# Patient Record
Sex: Female | Born: 1955 | Race: White | Hispanic: No | Marital: Married | Smoking: Never smoker
Health system: Southern US, Community
[De-identification: ages and names within clinical notes are randomized; demographics above are authoritative.]

---

## 1983-07-15 HISTORY — PX: BUNIONECTOMY: SHX129

## 1996-07-14 HISTORY — PX: APPENDECTOMY: SHX54

## 2021-02-18 ENCOUNTER — Ambulatory Visit: Payer: Medicare Other | Admitting: Adult Health

## 2021-04-02 ENCOUNTER — Ambulatory Visit: Payer: Medicare Other | Admitting: Adult Health

## 2021-05-06 ENCOUNTER — Ambulatory Visit: Payer: Medicare Other | Admitting: Adult Health

## 2021-05-23 ENCOUNTER — Ambulatory Visit: Payer: Medicare Other | Admitting: Adult Health

## 2021-07-23 ENCOUNTER — Ambulatory Visit (INDEPENDENT_AMBULATORY_CARE_PROVIDER_SITE_OTHER): Payer: Medicare Other | Admitting: Internal Medicine

## 2021-07-23 ENCOUNTER — Encounter: Payer: Self-pay | Admitting: Internal Medicine

## 2021-07-23 ENCOUNTER — Other Ambulatory Visit: Payer: Self-pay

## 2021-07-23 VITALS — BP 148/75 | HR 72 | Temp 97.5°F | Resp 18 | Ht <= 58 in | Wt 133.4 lb

## 2021-07-23 DIAGNOSIS — R030 Elevated blood-pressure reading, without diagnosis of hypertension: Secondary | ICD-10-CM | POA: Diagnosis not present

## 2021-07-23 DIAGNOSIS — H539 Unspecified visual disturbance: Secondary | ICD-10-CM

## 2021-07-23 DIAGNOSIS — R7309 Other abnormal glucose: Secondary | ICD-10-CM | POA: Diagnosis not present

## 2021-07-23 DIAGNOSIS — E559 Vitamin D deficiency, unspecified: Secondary | ICD-10-CM | POA: Diagnosis not present

## 2021-07-23 DIAGNOSIS — Z1231 Encounter for screening mammogram for malignant neoplasm of breast: Secondary | ICD-10-CM

## 2021-07-23 DIAGNOSIS — Z6826 Body mass index (BMI) 26.0-26.9, adult: Secondary | ICD-10-CM | POA: Insufficient documentation

## 2021-07-23 DIAGNOSIS — Z0001 Encounter for general adult medical examination with abnormal findings: Secondary | ICD-10-CM | POA: Diagnosis not present

## 2021-07-23 DIAGNOSIS — Z1159 Encounter for screening for other viral diseases: Secondary | ICD-10-CM | POA: Diagnosis not present

## 2021-07-23 DIAGNOSIS — Z6827 Body mass index (BMI) 27.0-27.9, adult: Secondary | ICD-10-CM | POA: Insufficient documentation

## 2021-07-23 DIAGNOSIS — E785 Hyperlipidemia, unspecified: Secondary | ICD-10-CM | POA: Diagnosis not present

## 2021-07-23 DIAGNOSIS — E663 Overweight: Secondary | ICD-10-CM | POA: Insufficient documentation

## 2021-07-23 DIAGNOSIS — Z780 Asymptomatic menopausal state: Secondary | ICD-10-CM | POA: Diagnosis not present

## 2021-07-23 DIAGNOSIS — Z6825 Body mass index (BMI) 25.0-25.9, adult: Secondary | ICD-10-CM | POA: Insufficient documentation

## 2021-07-23 NOTE — Assessment & Plan Note (Signed)
Elevated  today, manual repeat improved but still elevated We will check at her next visit and if remains elevated will discuss medication therapy

## 2021-07-23 NOTE — Progress Notes (Signed)
HPI  Patient presents the clinic today to establish care.  She reports she recently moved from [place].  She has not seen a PCP in a few years.    Her BP today is 166/78.  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  She reports her blood pressure at home runs in the 120s over 70s.  Flu: never Tetanus: < 10 years ago Covid: never Shingrix: never Pneumovax: never Prevnar: never Pap smear: 2-3 years ago Mammogram: 2 years ago Bone density: never Colon screening: within the last 5 years Vision screening: annually Dentist: biannually  Diet: She does eat meat. She consumes fruits and veggies. She does eat some fried foods. She drinks mostly water, decaf iced tea. Exercise: Tai Chi, walking  History reviewed. No pertinent past medical history.  Current Outpatient Medications  Medication Sig Dispense Refill   Cholecalciferol (VITAMIN D3) 25 MCG (1000 UT) CAPS Take by mouth.     Multiple Vitamin (MULTIVITAMIN) tablet Take 1 tablet by mouth daily.     No current facility-administered medications for this visit.    Allergies  Allergen Reactions   Grass Pollen(K-O-R-T-Swt Vern)    Tree Extract     History reviewed. No pertinent family history.  Social History   Socioeconomic History   Marital status: Single    Spouse name: Not on file   Number of children: Not on file   Years of education: Not on file   Highest education level: Not on file  Occupational History   Not on file  Tobacco Use   Smoking status: Never   Smokeless tobacco: Never  Vaping Use   Vaping Use: Never used  Substance and Sexual Activity   Alcohol use: Yes    Comment: occasionally   Drug use: Never   Sexual activity: Not on file  Other Topics Concern   Not on file  Social History Narrative   Not on file   Social Determinants of Health   Financial Resource Strain: Not on file  Food Insecurity: Not on file  Transportation Needs: Not on file  Physical Activity: Not on file   Stress: Not on file  Social Connections: Not on file  Intimate Partner Violence: Not on file    ROS:  Constitutional: Denies fever, malaise, fatigue, headache or abrupt weight changes.  HEENT: Denies eye pain, eye redness, ear pain, ringing in the ears, wax buildup, runny nose, nasal congestion, bloody nose, or sore throat. Respiratory: Denies difficulty breathing, shortness of breath, cough or sputum production.   Cardiovascular: Denies chest pain, chest tightness, palpitations or swelling in the hands or feet.  Gastrointestinal: Denies abdominal pain, bloating, constipation, diarrhea or blood in the stool.  GU: Denies frequency, urgency, pain with urination, blood in urine, odor or discharge. Musculoskeletal: Denies decrease in range of motion, difficulty with gait, muscle pain or joint pain and swelling.  Skin: Denies redness, rashes, lesions or ulcercations.  Neurological: Denies dizziness, difficulty with memory, difficulty with speech or problems with balance and coordination.  Psych: Denies anxiety, depression, SI/HI.  No other specific complaints in a complete review of systems (except as listed in HPI above).  PE:  BP (!) 166/78 (BP Location: Left Arm, Patient Position: Sitting, Cuff Size: Normal)    Pulse 72    Temp (!) 97.5 F (36.4 C) (Temporal)    Resp 18    Ht 4' 10"  (1.473 m)    Wt 133 lb 6.4 oz (60.5 kg)    LMP 07/23/2013  SpO2 100%    BMI 27.88 kg/m  Wt Readings from Last 3 Encounters:  07/23/21 133 lb 6.4 oz (60.5 kg)    General: Appears her stated age, overweight, in NAD. HEENT: Head: normal shape and size; Eyes: sclera white and EOMs intact;  Neck: Neck supple, trachea midline. No masses, lumps or thyromegaly present.  Cardiovascular: Normal rate and rhythm. S1,S2 noted.  Murmur noted. No JVD or BLE edema. No carotid bruits noted. Pulmonary/Chest: Normal effort and positive vesicular breath sounds. No respiratory distress. No wheezes, rales or ronchi noted.   Abdomen: Soft and nontender. Normal bowel sounds. No distention or masses noted. Liver, spleen and kidneys non palpable. Musculoskeletal: Strength 5/5 BUE/BLE. No difficulty with gait.  Neurological: Alert and oriented. Cranial nerves II-XII grossly intact. Coordination normal.  Psychiatric: Mood and affect normal. Behavior is normal. Judgment and thought content normal.    Assessment and Plan:  Preventative Health Maintenance:  She declines flu shot Tetanus UTD per her report She declines COVID-vaccine She is considering Shingrix vaccine, she will check coverage with her insurance company and get this at the pharmacy if she decides to get it She declines Pneumovax and Prevnar Pap smear UTD per her report Mammogram and bone density ordered-she will call to schedule Colon screening UTD per her report Encouraged her to consume a balanced diet and exercise regimen Advised her to see an eye doctor and dentist annually Will check CBC, c-Met, lipid, A1c, hep C and vitamin D today  RTC in 1 year, sooner if needed Webb Silversmith, NP This visit occurred during the SARS-CoV-2 public health emergency.  Safety protocols were in place, including screening questions prior to the visit, additional usage of staff PPE, and extensive cleaning of exam room while observing appropriate contact time as indicated for disinfecting solutions.    Webb Silversmith, NP This visit occurred during the SARS-CoV-2 public health emergency.  Safety protocols were in place, including screening questions prior to the visit, additional usage of staff PPE, and extensive cleaning of exam room while observing appropriate contact time as indicated for disinfecting solutions.

## 2021-07-23 NOTE — Patient Instructions (Signed)
Health Maintenance for Postmenopausal Women ?Menopause is a normal process in which your ability to get pregnant comes to an end. This process happens slowly over many months or years, usually between the ages of 48 and 55. Menopause is complete when you have missed your menstrual period for 12 months. ?It is important to talk with your health care provider about some of the most common conditions that affect women after menopause (postmenopausal women). These include heart disease, cancer, and bone loss (osteoporosis). Adopting a healthy lifestyle and getting preventive care can help to promote your health and wellness. The actions you take can also lower your chances of developing some of these common conditions. ?What are the signs and symptoms of menopause? ?During menopause, you may have the following symptoms: ?Hot flashes. These can be moderate or severe. ?Night sweats. ?Decrease in sex drive. ?Mood swings. ?Headaches. ?Tiredness (fatigue). ?Irritability. ?Memory problems. ?Problems falling asleep or staying asleep. ?Talk with your health care provider about treatment options for your symptoms. ?Do I need hormone replacement therapy? ?Hormone replacement therapy is effective in treating symptoms that are caused by menopause, such as hot flashes and night sweats. ?Hormone replacement carries certain risks, especially as you become older. If you are thinking about using estrogen or estrogen with progestin, discuss the benefits and risks with your health care provider. ?How can I reduce my risk for heart disease and stroke? ?The risk of heart disease, heart attack, and stroke increases as you age. One of the causes may be a change in the body's hormones during menopause. This can affect how your body uses dietary fats, triglycerides, and cholesterol. Heart attack and stroke are medical emergencies. There are many things that you can do to help prevent heart disease and stroke. ?Watch your blood pressure ?High  blood pressure causes heart disease and increases the risk of stroke. This is more likely to develop in people who have high blood pressure readings or are overweight. ?Have your blood pressure checked: ?Every 3-5 years if you are 18-39 years of age. ?Every year if you are 40 years old or older. ?Eat a healthy diet ? ?Eat a diet that includes plenty of vegetables, fruits, low-fat dairy products, and lean protein. ?Do not eat a lot of foods that are high in solid fats, added sugars, or sodium. ?Get regular exercise ?Get regular exercise. This is one of the most important things you can do for your health. Most adults should: ?Try to exercise for at least 150 minutes each week. The exercise should increase your heart rate and make you sweat (moderate-intensity exercise). ?Try to do strengthening exercises at least twice each week. Do these in addition to the moderate-intensity exercise. ?Spend less time sitting. Even light physical activity can be beneficial. ?Other tips ?Work with your health care provider to achieve or maintain a healthy weight. ?Do not use any products that contain nicotine or tobacco. These products include cigarettes, chewing tobacco, and vaping devices, such as e-cigarettes. If you need help quitting, ask your health care provider. ?Know your numbers. Ask your health care provider to check your cholesterol and your blood sugar (glucose). Continue to have your blood tested as directed by your health care provider. ?Do I need screening for cancer? ?Depending on your health history and family history, you may need to have cancer screenings at different stages of your life. This may include screening for: ?Breast cancer. ?Cervical cancer. ?Lung cancer. ?Colorectal cancer. ?What is my risk for osteoporosis? ?After menopause, you may be   at increased risk for osteoporosis. Osteoporosis is a condition in which bone destruction happens more quickly than new bone creation. To help prevent osteoporosis or  the bone fractures that can happen because of osteoporosis, you may take the following actions: ?If you are 19-50 years old, get at least 1,000 mg of calcium and at least 600 international units (IU) of vitamin D per day. ?If you are older than age 50 but younger than age 70, get at least 1,200 mg of calcium and at least 600 international units (IU) of vitamin D per day. ?If you are older than age 70, get at least 1,200 mg of calcium and at least 800 international units (IU) of vitamin D per day. ?Smoking and drinking excessive alcohol increase the risk of osteoporosis. Eat foods that are rich in calcium and vitamin D, and do weight-bearing exercises several times each week as directed by your health care provider. ?How does menopause affect my mental health? ?Depression may occur at any age, but it is more common as you become older. Common symptoms of depression include: ?Feeling depressed. ?Changes in sleep patterns. ?Changes in appetite or eating patterns. ?Feeling an overall lack of motivation or enjoyment of activities that you previously enjoyed. ?Frequent crying spells. ?Talk with your health care provider if you think that you are experiencing any of these symptoms. ?General instructions ?See your health care provider for regular wellness exams and vaccines. This may include: ?Scheduling regular health, dental, and eye exams. ?Getting and maintaining your vaccines. These include: ?Influenza vaccine. Get this vaccine each year before the flu season begins. ?Pneumonia vaccine. ?Shingles vaccine. ?Tetanus, diphtheria, and pertussis (Tdap) booster vaccine. ?Your health care provider may also recommend other immunizations. ?Tell your health care provider if you have ever been abused or do not feel safe at home. ?Summary ?Menopause is a normal process in which your ability to get pregnant comes to an end. ?This condition causes hot flashes, night sweats, decreased interest in sex, mood swings, headaches, or lack  of sleep. ?Treatment for this condition may include hormone replacement therapy. ?Take actions to keep yourself healthy, including exercising regularly, eating a healthy diet, watching your weight, and checking your blood pressure and blood sugar levels. ?Get screened for cancer and depression. Make sure that you are up to date with all your vaccines. ?This information is not intended to replace advice given to you by your health care provider. Make sure you discuss any questions you have with your health care provider. ?Document Revised: 11/19/2020 Document Reviewed: 11/19/2020 ?Elsevier Patient Education ? 2022 [employer] ? ?

## 2021-07-23 NOTE — Assessment & Plan Note (Signed)
Encourage diet and exercise for weight loss 

## 2021-07-24 LAB — LIPID PANEL
Cholesterol: 220 mg/dL — ABNORMAL HIGH (ref <200)
HDL: 39 mg/dL — ABNORMAL LOW (ref >=50)
LDL Cholesterol (Calc): 138 mg/dL (calc) — ABNORMAL HIGH
Non-HDL Cholesterol (Calc): 181 mg/dL (calc) — ABNORMAL HIGH (ref <130)
Total CHOL/HDL Ratio: 5.6 (calc) — ABNORMAL HIGH (ref <5.0)
Triglycerides: 303 mg/dL — ABNORMAL HIGH (ref <150)

## 2021-07-24 LAB — COMPLETE METABOLIC PANEL WITH GFR
AG Ratio: 1.8 (calc) (ref 1.0–2.5)
ALT: 18 U/L (ref 6–29)
AST: 19 U/L (ref 10–35)
Albumin: 4.3 g/dL (ref 3.6–5.1)
Alkaline phosphatase (APISO): 66 U/L (ref 37–153)
BUN: 9 mg/dL (ref 7–25)
CO2: 27 mmol/L (ref 20–32)
Calcium: 8.9 mg/dL (ref 8.6–10.4)
Chloride: 102 mmol/L (ref 98–110)
Creat: 0.58 mg/dL (ref 0.50–1.05)
Globulin: 2.4 g/dL (calc) (ref 1.9–3.7)
Glucose, Bld: 109 mg/dL (ref 65–139)
Potassium: 3.6 mmol/L (ref 3.5–5.3)
Sodium: 137 mmol/L (ref 135–146)
Total Bilirubin: 0.3 mg/dL (ref 0.2–1.2)
Total Protein: 6.7 g/dL (ref 6.1–8.1)
eGFR: 100 mL/min/{1.73_m2} (ref >=60)

## 2021-07-24 LAB — VITAMIN D 25 HYDROXY (VIT D DEFICIENCY, FRACTURES): Vit D, 25-Hydroxy: 60 ng/mL (ref 30–100)

## 2021-07-24 LAB — CBC
HCT: 39.9 % (ref 35.0–45.0)
Hemoglobin: 13.6 g/dL (ref 11.7–15.5)
MCH: 28.8 pg (ref 27.0–33.0)
MCHC: 34.1 g/dL (ref 32.0–36.0)
MCV: 84.5 fL (ref 80.0–100.0)
MPV: 9 fL (ref 7.5–12.5)
Platelets: 332 10*3/uL (ref 140–400)
RBC: 4.72 10*6/uL (ref 3.80–5.10)
RDW: 13.1 % (ref 11.0–15.0)
WBC: 3.3 10*3/uL — ABNORMAL LOW (ref 3.8–10.8)

## 2021-07-24 LAB — HEMOGLOBIN A1C
Hgb A1c MFr Bld: 6.3 % of total Hgb — ABNORMAL HIGH (ref <5.7)
Mean Plasma Glucose: 134 mg/dL
eAG (mmol/L): 7.4 mmol/L

## 2021-07-24 LAB — HEPATITIS C ANTIBODY
Hepatitis C Ab: NONREACTIVE
SIGNAL TO CUT-OFF: 0.08 (ref <1.00)

## 2021-07-26 DIAGNOSIS — H2513 Age-related nuclear cataract, bilateral: Secondary | ICD-10-CM | POA: Diagnosis not present

## 2021-10-24 ENCOUNTER — Encounter: Payer: Self-pay | Admitting: Internal Medicine

## 2021-10-24 ENCOUNTER — Ambulatory Visit (INDEPENDENT_AMBULATORY_CARE_PROVIDER_SITE_OTHER): Payer: Medicare Other | Admitting: Internal Medicine

## 2021-10-24 DIAGNOSIS — E785 Hyperlipidemia, unspecified: Secondary | ICD-10-CM | POA: Insufficient documentation

## 2021-10-24 DIAGNOSIS — R030 Elevated blood-pressure reading, without diagnosis of hypertension: Secondary | ICD-10-CM | POA: Diagnosis not present

## 2021-10-24 DIAGNOSIS — Z6826 Body mass index (BMI) 26.0-26.9, adult: Secondary | ICD-10-CM

## 2021-10-24 DIAGNOSIS — D709 Neutropenia, unspecified: Secondary | ICD-10-CM | POA: Insufficient documentation

## 2021-10-24 DIAGNOSIS — E782 Mixed hyperlipidemia: Secondary | ICD-10-CM | POA: Diagnosis not present

## 2021-10-24 DIAGNOSIS — R7303 Prediabetes: Secondary | ICD-10-CM | POA: Diagnosis not present

## 2021-10-24 DIAGNOSIS — E663 Overweight: Secondary | ICD-10-CM

## 2021-10-24 NOTE — Assessment & Plan Note (Signed)
A1c today Encourage low-carb diet and exercise for weight loss 

## 2021-10-24 NOTE — Assessment & Plan Note (Signed)
C-Met and lipid profile today ?Encouraged her to consume a low-fat diet ?

## 2021-10-24 NOTE — Assessment & Plan Note (Signed)
Blood pressure is borderline here but much better control at home ?Reinforced DASH diet, exercise for weight loss ?We will continue to monitor for now.  If has significant increase in an office pressures, will discuss antihypertensive therapy. ?

## 2021-10-24 NOTE — Progress Notes (Signed)
? ?Subjective:  ? ? Patient ID: Kathleen Hardin, female    DOB: April 04, 1956, 66 y.o.   MRN: [id number] ? ?HPI ? ?Patient presents to clinic today for follow-up of chronic conditions. ? ?Prediabetes: Her last A1c was 6.3%, 07/2021.  She is not taking any oral diabetic medication at this time.  She does not check her sugars. ? ?HLD: Her last LDL was 138, triglycerides 303, 07/2021.  She is not taking any cholesterol-lowering medication at this time.  She has been trying to consume a low-fat diet. ? ?Neutropenia: Her last WBC was 3.3, 07/2021.  She has never been told that she has low white blood cells in the past.  She does not follow with hematology. ? ?HTN: Her BP today is 144/92.  She attributes this to whitecoat syndrome. Her BP at home has been 120/59, 140/53, 124/65, 116/59  She is not currently taking any oral antihypertensive medications at this time.  There is no ECG on file. ? ?Review of Systems ? ?   ?No past medical history on file. ? ?Current Outpatient Medications  ?Medication Sig Dispense Refill  ? Cholecalciferol (VITAMIN D3) 25 MCG (1000 UT) CAPS Take by mouth.    ? Multiple Vitamin (MULTIVITAMIN) tablet Take 1 tablet by mouth daily.    ? ?No current facility-administered medications for this visit.  ? ? ?Allergies  ?Allergen Reactions  ? Grass Pollen(K-O-R-T-Swt Vern)   ? Tree Extract   ? ? ?No family history on file. ? ?Social History  ? ?Socioeconomic History  ? Marital status: Married  ?  Spouse name: Not on file  ? Number of children: Not on file  ? Years of education: Not on file  ? Highest education level: Not on file  ?Occupational History  ? Not on file  ?Tobacco Use  ? Smoking status: Never  ? Smokeless tobacco: Never  ?Vaping Use  ? Vaping Use: Never used  ?Substance and Sexual Activity  ? Alcohol use: Yes  ?  Comment: occasionally  ? Drug use: Never  ? Sexual activity: Not on file  ?Other Topics Concern  ? Not on file  ?Social History Narrative  ? Not on file  ? ?Social Determinants of Health   ? ?Financial Resource Strain: Not on file  ?Food Insecurity: Not on file  ?Transportation Needs: Not on file  ?Physical Activity: Not on file  ?Stress: Not on file  ?Social Connections: Not on file  ?Intimate Partner Violence: Not on file  ? ? ? ?Constitutional: Denies fever, malaise, fatigue, headache or abrupt weight changes.  ?HEENT: Denies eye pain, eye redness, ear pain, ringing in the ears, wax buildup, runny nose, nasal congestion, bloody nose, or sore throat. ?Respiratory: Denies difficulty breathing, shortness of breath, cough or sputum production.   ?Cardiovascular: Denies chest pain, chest tightness, palpitations or swelling in the hands or feet.  ?Gastrointestinal: Denies abdominal pain, bloating, constipation, diarrhea or blood in the stool.  ?GU: Denies urgency, frequency, pain with urination, burning sensation, blood in urine, odor or discharge. ?Musculoskeletal: Denies decrease in range of motion, difficulty with gait, muscle pain or joint pain and swelling.  ?Skin: Denies redness, rashes, lesions or ulcercations.  ?Neurological: Denies dizziness, difficulty with memory, difficulty with speech or problems with balance and coordination.  ?Psych: Denies anxiety, depression, SI/HI. ? ?No other specific complaints in a complete review of systems (except as listed in HPI above). ? ?Objective:  ? Physical Exam ?BP (!) 148/88 (BP Location: Left Arm, Patient Position: Sitting, Cuff Size:  Normal)   Pulse 66   Temp (!) 96.9 ?F (36.1 ?C) (Temporal)   Wt 127 lb (57.6 kg)   SpO2 99%   BMI 26.54 kg/m?  ? ?Wt Readings from Last 3 Encounters:  ?07/23/21 133 lb 6.4 oz (60.5 kg)  ? ? ?General: Appears her stated age, overweight, in NAD. ?Skin: Warm, dry and intact.  ?Cardiovascular: Normal rate and rhythm. S1,S2 noted.  Murmur noted. No JVD or BLE edema. No carotid bruits noted. ?Pulmonary/Chest: Normal effort and positive vesicular breath sounds. No respiratory distress. No wheezes, rales or ronchi noted.   ?Musculoskeletal: No difficulty with gait.  ?Neurological: Alert and oriented.  ? ? ?BMET ?   ?Component Value Date/Time  ? NA 137 07/23/2021 1435  ? K 3.6 07/23/2021 1435  ? CL 102 07/23/2021 1435  ? CO2 27 07/23/2021 1435  ? GLUCOSE 109 07/23/2021 1435  ? BUN 9 07/23/2021 1435  ? CREATININE 0.58 07/23/2021 1435  ? CALCIUM 8.9 07/23/2021 1435  ? ? ?Lipid Panel  ?   ?Component Value Date/Time  ? CHOL 220 (H) 07/23/2021 1435  ? TRIG 303 (H) 07/23/2021 1435  ? HDL 39 (L) 07/23/2021 1435  ? CHOLHDL 5.6 (H) 07/23/2021 1435  ? LDLCALC 138 (H) 07/23/2021 1435  ? ? ?CBC ?   ?Component Value Date/Time  ? WBC 3.3 (L) 07/23/2021 1435  ? RBC 4.72 07/23/2021 1435  ? HGB 13.6 07/23/2021 1435  ? HCT 39.9 07/23/2021 1435  ? PLT 332 07/23/2021 1435  ? MCV 84.5 07/23/2021 1435  ? MCH 28.8 07/23/2021 1435  ? MCHC 34.1 07/23/2021 1435  ? RDW 13.1 07/23/2021 1435  ? ? ?Hgb A1C ?Lab Results  ?Component Value Date  ? HGBA1C 6.3 (H) 07/23/2021  ? ? ? ? ? ? ?   ?Assessment & Plan:  ? ?Nicki Reaper, NP ? ?

## 2021-10-24 NOTE — Patient Instructions (Signed)

## 2021-10-24 NOTE — Assessment & Plan Note (Signed)
CBC with differential ?Consider referral to hematology for further evaluation if WBC remains low ?

## 2021-10-24 NOTE — Assessment & Plan Note (Signed)
Encourage diet and exercise for weight loss 

## 2021-10-25 LAB — CBC WITH DIFFERENTIAL/PLATELET
Absolute Monocytes: 246 cells/uL (ref 200–950)
Basophils Absolute: 22 cells/uL (ref 0–200)
Basophils Relative: 0.5 %
Eosinophils Absolute: 62 cells/uL (ref 15–500)
Eosinophils Relative: 1.4 %
HCT: 41.4 % (ref 35.0–45.0)
Hemoglobin: 13.6 g/dL (ref 11.7–15.5)
Lymphs Abs: 1052 cells/uL (ref 850–3900)
MCH: 28.5 pg (ref 27.0–33.0)
MCHC: 32.9 g/dL (ref 32.0–36.0)
MCV: 86.8 fL (ref 80.0–100.0)
MPV: 9.2 fL (ref 7.5–12.5)
Monocytes Relative: 5.6 %
Neutro Abs: 3018 cells/uL (ref 1500–7800)
Neutrophils Relative %: 68.6 %
Platelets: 391 10*3/uL (ref 140–400)
RBC: 4.77 10*6/uL (ref 3.80–5.10)
RDW: 12.8 % (ref 11.0–15.0)
Total Lymphocyte: 23.9 %
WBC: 4.4 10*3/uL (ref 3.8–10.8)

## 2021-10-25 LAB — LIPID PANEL
Cholesterol: 242 mg/dL — ABNORMAL HIGH (ref <200)
HDL: 53 mg/dL (ref >=50)
LDL Cholesterol (Calc): 154 mg/dL (calc) — ABNORMAL HIGH
Non-HDL Cholesterol (Calc): 189 mg/dL (calc) — ABNORMAL HIGH (ref <130)
Total CHOL/HDL Ratio: 4.6 (calc) (ref <5.0)
Triglycerides: 208 mg/dL — ABNORMAL HIGH (ref <150)

## 2021-10-25 LAB — HEMOGLOBIN A1C
Hgb A1c MFr Bld: 6.1 % of total Hgb — ABNORMAL HIGH (ref <5.7)
Mean Plasma Glucose: 128 mg/dL
eAG (mmol/L): 7.1 mmol/L

## 2021-10-30 ENCOUNTER — Other Ambulatory Visit: Payer: Medicare Other

## 2021-11-05 ENCOUNTER — Telehealth: Payer: Self-pay

## 2021-11-05 NOTE — Telephone Encounter (Signed)
Left message for patient to call back and schedule her  [facility] to [employer] Visit (AWV) virtually, telephone or face to face. ?  ?Donnie Mesa, [place] ?[phone]  ?

## 2021-12-31 ENCOUNTER — Encounter: Payer: Self-pay | Admitting: Radiology

## 2021-12-31 ENCOUNTER — Ambulatory Visit
Admission: RE | Admit: 2021-12-31 | Discharge: 2021-12-31 | Disposition: A | Discharge status: Home / Self Care | Payer: Medicare Other | Source: Ambulatory Visit | Attending: Internal Medicine | Admitting: Internal Medicine

## 2021-12-31 DIAGNOSIS — M8589 Other specified disorders of bone density and structure, multiple sites: Secondary | ICD-10-CM | POA: Diagnosis not present

## 2021-12-31 DIAGNOSIS — Z780 Asymptomatic menopausal state: Secondary | ICD-10-CM | POA: Diagnosis not present

## 2021-12-31 DIAGNOSIS — Z1231 Encounter for screening mammogram for malignant neoplasm of breast: Secondary | ICD-10-CM

## 2022-01-09 ENCOUNTER — Other Ambulatory Visit: Payer: Self-pay | Admitting: *Deleted

## 2022-01-09 ENCOUNTER — Inpatient Hospital Stay
Admission: RE | Admit: 2022-01-09 | Discharge: 2022-01-09 | Disposition: A | Discharge status: Home / Self Care | Payer: Self-pay | Source: Ambulatory Visit | Attending: *Deleted | Admitting: *Deleted

## 2022-01-09 DIAGNOSIS — Z1231 Encounter for screening mammogram for malignant neoplasm of breast: Secondary | ICD-10-CM

## 2022-06-23 ENCOUNTER — Telehealth: Payer: Self-pay

## 2022-06-23 NOTE — Telephone Encounter (Signed)
I attempted to reach the patient and schedule their Medicare Annual Wellness Visit (AWV) virtually, by telephone, or face-to-face. No answer, no voicemail.    Laurel Dimmer, CMA [phone]

## 2022-07-18 ENCOUNTER — Telehealth: Payer: Self-pay | Admitting: Internal Medicine

## 2022-07-18 DIAGNOSIS — H539 Unspecified visual disturbance: Secondary | ICD-10-CM

## 2022-07-18 NOTE — Telephone Encounter (Signed)
Referral placed.  She needs to set up an appointment for her annual exam.  She was due in September.

## 2022-07-18 NOTE — Telephone Encounter (Signed)
Annual exam schedule for 07/24/2022  Thanks,   -Mickel Baas

## 2022-07-18 NOTE — Telephone Encounter (Signed)
Referral Request - Has patient seen PCP for this complaint? Yes.   *If NO, is insurance requiring patient see PCP for this issue before PCP can refer them? Referral for which specialty: optometrist Preferred provider/office: any Reason for referral: yearly eye exam  pt had referral last year and never went to eye dr

## 2022-07-18 NOTE — Addendum Note (Signed)
Addended by: Jearld Fenton on: 07/18/2022 01:30 PM   Modules accepted: Orders

## 2022-07-24 ENCOUNTER — Encounter: Payer: Self-pay | Admitting: Internal Medicine

## 2022-07-24 ENCOUNTER — Ambulatory Visit (INDEPENDENT_AMBULATORY_CARE_PROVIDER_SITE_OTHER): Payer: Medicare Other | Admitting: Internal Medicine

## 2022-07-24 VITALS — BP 122/78 | HR 83 | Temp 96.9°F | Wt 131.0 lb

## 2022-07-24 DIAGNOSIS — E782 Mixed hyperlipidemia: Secondary | ICD-10-CM

## 2022-07-24 DIAGNOSIS — R7303 Prediabetes: Secondary | ICD-10-CM | POA: Diagnosis not present

## 2022-07-24 DIAGNOSIS — M8580 Other specified disorders of bone density and structure, unspecified site: Secondary | ICD-10-CM

## 2022-07-24 DIAGNOSIS — Z0001 Encounter for general adult medical examination with abnormal findings: Secondary | ICD-10-CM

## 2022-07-24 DIAGNOSIS — E663 Overweight: Secondary | ICD-10-CM

## 2022-07-24 DIAGNOSIS — Z6827 Body mass index (BMI) 27.0-27.9, adult: Secondary | ICD-10-CM

## 2022-07-24 NOTE — Assessment & Plan Note (Signed)
Encourage diet and exercise for weight loss 

## 2022-07-24 NOTE — Patient Instructions (Signed)
Health Maintenance for Postmenopausal Women Menopause is a normal process in which your ability to get pregnant comes to an end. This process happens slowly over many months or years, usually between the ages of 48 and 55. Menopause is complete when you have missed your menstrual period for 12 months. It is important to talk with your health care provider about some of the most common conditions that affect women after menopause (postmenopausal women). These include heart disease, cancer, and bone loss (osteoporosis). Adopting a healthy lifestyle and getting preventive care can help to promote your health and wellness. The actions you take can also lower your chances of developing some of these common conditions. What are the signs and symptoms of menopause? During menopause, you may have the following symptoms: Hot flashes. These can be moderate or severe. Night sweats. Decrease in sex drive. Mood swings. Headaches. Tiredness (fatigue). Irritability. Memory problems. Problems falling asleep or staying asleep. Talk with your health care provider about treatment options for your symptoms. Do I need hormone replacement therapy? Hormone replacement therapy is effective in treating symptoms that are caused by menopause, such as hot flashes and night sweats. Hormone replacement carries certain risks, especially as you become older. If you are thinking about using estrogen or estrogen with progestin, discuss the benefits and risks with your health care provider. How can I reduce my risk for heart disease and stroke? The risk of heart disease, heart attack, and stroke increases as you age. One of the causes may be a change in the body's hormones during menopause. This can affect how your body uses dietary fats, triglycerides, and cholesterol. Heart attack and stroke are medical emergencies. There are many things that you can do to help prevent heart disease and stroke. Watch your blood pressure High  blood pressure causes heart disease and increases the risk of stroke. This is more likely to develop in people who have high blood pressure readings or are overweight. Have your blood pressure checked: Every 3-5 years if you are 18-39 years of age. Every year if you are 40 years old or older. Eat a healthy diet  Eat a diet that includes plenty of vegetables, fruits, low-fat dairy products, and lean protein. Do not eat a lot of foods that are high in solid fats, added sugars, or sodium. Get regular exercise Get regular exercise. This is one of the most important things you can do for your health. Most adults should: Try to exercise for at least 150 minutes each week. The exercise should increase your heart rate and make you sweat (moderate-intensity exercise). Try to do strengthening exercises at least twice each week. Do these in addition to the moderate-intensity exercise. Spend less time sitting. Even light physical activity can be beneficial. Other tips Work with your health care provider to achieve or maintain a healthy weight. Do not use any products that contain nicotine or tobacco. These products include cigarettes, chewing tobacco, and vaping devices, such as e-cigarettes. If you need help quitting, ask your health care provider. Know your numbers. Ask your health care provider to check your cholesterol and your blood sugar (glucose). Continue to have your blood tested as directed by your health care provider. Do I need screening for cancer? Depending on your health history and family history, you may need to have cancer screenings at different stages of your life. This may include screening for: Breast cancer. Cervical cancer. Lung cancer. Colorectal cancer. What is my risk for osteoporosis? After menopause, you may be   at increased risk for osteoporosis. Osteoporosis is a condition in which bone destruction happens more quickly than new bone creation. To help prevent osteoporosis or  the bone fractures that can happen because of osteoporosis, you may take the following actions: If you are 19-50 years old, get at least 1,000 mg of calcium and at least 600 international units (IU) of vitamin D per day. If you are older than age 50 but younger than age 70, get at least 1,200 mg of calcium and at least 600 international units (IU) of vitamin D per day. If you are older than age 70, get at least 1,200 mg of calcium and at least 800 international units (IU) of vitamin D per day. Smoking and drinking excessive alcohol increase the risk of osteoporosis. Eat foods that are rich in calcium and vitamin D, and do weight-bearing exercises several times each week as directed by your health care provider. How does menopause affect my mental health? Depression may occur at any age, but it is more common as you become older. Common symptoms of depression include: Feeling depressed. Changes in sleep patterns. Changes in appetite or eating patterns. Feeling an overall lack of motivation or enjoyment of activities that you previously enjoyed. Frequent crying spells. Talk with your health care provider if you think that you are experiencing any of these symptoms. General instructions See your health care provider for regular wellness exams and vaccines. This may include: Scheduling regular health, dental, and eye exams. Getting and maintaining your vaccines. These include: Influenza vaccine. Get this vaccine each year before the flu season begins. Pneumonia vaccine. Shingles vaccine. Tetanus, diphtheria, and pertussis (Tdap) booster vaccine. Your health care provider may also recommend other immunizations. Tell your health care provider if you have ever been abused or do not feel safe at home. Summary Menopause is a normal process in which your ability to get pregnant comes to an end. This condition causes hot flashes, night sweats, decreased interest in sex, mood swings, headaches, or lack  of sleep. Treatment for this condition may include hormone replacement therapy. Take actions to keep yourself healthy, including exercising regularly, eating a healthy diet, watching your weight, and checking your blood pressure and blood sugar levels. Get screened for cancer and depression. Make sure that you are up to date with all your vaccines. This information is not intended to replace advice given to you by your health care provider. Make sure you discuss any questions you have with your health care provider. Document Revised: 11/19/2020 Document Reviewed: 11/19/2020 Elsevier Patient Education  2023 Elsevier Inc.  

## 2022-07-24 NOTE — Progress Notes (Signed)
Subjective:    Patient ID: Kathleen Hardin, female    DOB: 17-Aug-1955, 67 y.o.   MRN: [id number]  HPI  Pt presents to the clinic today for her annual exam.  Flu: never Tetanus: unsure Covid: never Pneumovax: never Prevnar: never Shingrix: never Pap smear: < 10 years ago Mammogram: 12/2021 Bone Density: 12/2021 Colon screening: < 10 years ago Vision screening: annually Dentist: biannually  Diet: She does eat meat. She consumes fruits and veggies. She does eat some fried foods. She drinks mostly water, decaf tea Exercise: tai-chi 3-5 times per week, walking  Review of Systems     No past medical history on file.  Current Outpatient Medications  Medication Sig Dispense Refill   Cholecalciferol (VITAMIN D3) 25 MCG (1000 UT) CAPS Take by mouth.     Multiple Vitamin (MULTIVITAMIN) tablet Take 1 tablet by mouth daily.     No current facility-administered medications for this visit.    Allergies  Allergen Reactions   Grass Pollen(K-O-R-T-Swt Vern)    Tree Extract     No family history on file.  Social History   Socioeconomic History   Marital status: Married    Spouse name: Not on file   Number of children: Not on file   Years of education: Not on file   Highest education level: Not on file  Occupational History   Not on file  Tobacco Use   Smoking status: Never   Smokeless tobacco: Never  Vaping Use   Vaping Use: Never used  Substance and Sexual Activity   Alcohol use: Yes    Comment: occasionally   Drug use: Never   Sexual activity: Not on file  Other Topics Concern   Not on file  Social History Narrative   Not on file   Social Determinants of Health   Financial Resource Strain: Not on file  Food Insecurity: Not on file  Transportation Needs: Not on file  Physical Activity: Not on file  Stress: Not on file  Social Connections: Not on file  Intimate Partner Violence: Not on file     Constitutional: Denies fever, malaise, fatigue, headache or  abrupt weight changes.  HEENT: Denies eye pain, eye redness, ear pain, ringing in the ears, wax buildup, runny nose, nasal congestion, bloody nose, or sore throat. Respiratory: Denies difficulty breathing, shortness of breath, cough or sputum production.   Cardiovascular: Denies chest pain, chest tightness, palpitations or swelling in the hands or feet.  Gastrointestinal: Denies abdominal pain, bloating, constipation, diarrhea or blood in the stool.  GU: Denies urgency, frequency, pain with urination, burning sensation, blood in urine, odor or discharge. Musculoskeletal: Denies decrease in range of motion, difficulty with gait, muscle pain or joint pain and swelling.  Skin: Patient reports eczema of hands.  Denies lesions or ulcercations.  Neurological: Denies dizziness, difficulty with memory, difficulty with speech or problems with balance and coordination.  Psych: Denies anxiety, depression, SI/HI.  No other specific complaints in a complete review of systems (except as listed in HPI above).  Objective:   Physical Exam  BP 122/78 (BP Location: Left Arm, Patient Position: Sitting, Cuff Size: Normal)   Pulse 83   Temp (!) 96.9 F (36.1 C) (Temporal)   Wt 131 lb (59.4 kg)   LMP 07/23/2013   SpO2 96%   BMI 27.38 kg/m   Wt Readings from Last 3 Encounters:  10/24/21 127 lb (57.6 kg)  07/23/21 133 lb 6.4 oz (60.5 kg)    General: Appears her stated age,  overweight in NAD. Skin: Warm, dry and intact.  Eczema noted of bilateral hands. HEENT: Head: normal shape and size; Eyes: sclera white, no icterus, conjunctiva pink, PERRLA and EOMs intact;  Neck:  Neck supple, trachea midline. No masses, lumps or thyromegaly present.  Cardiovascular: Normal rate and rhythm. S1,S2 noted.  No murmur, rubs or gallops noted. No JVD or BLE edema. No carotid bruits noted. Pulmonary/Chest: Normal effort and positive vesicular breath sounds. No respiratory distress. No wheezes, rales or ronchi noted.   Abdomen: Normal bowel sounds.  Musculoskeletal: Strength 5/5 BUE/BLE. No difficulty with gait.  Neurological: Alert and oriented. Cranial nerves II-XII grossly intact. Coordination normal.  Psychiatric: Mood and affect normal. Behavior is normal. Judgment and thought content normal.    BMET    Component Value Date/Time   NA 137 07/23/2021 1435   K 3.6 07/23/2021 1435   CL 102 07/23/2021 1435   CO2 27 07/23/2021 1435   GLUCOSE 109 07/23/2021 1435   BUN 9 07/23/2021 1435   CREATININE 0.58 07/23/2021 1435   CALCIUM 8.9 07/23/2021 1435    Lipid Panel     Component Value Date/Time   CHOL 242 (H) 10/24/2021 0822   TRIG 208 (H) 10/24/2021 0822   HDL 53 10/24/2021 0822   CHOLHDL 4.6 10/24/2021 0822   LDLCALC 154 (H) 10/24/2021 0822    CBC    Component Value Date/Time   WBC 4.4 10/24/2021 0822   RBC 4.77 10/24/2021 0822   HGB 13.6 10/24/2021 0822   HCT 41.4 10/24/2021 0822   PLT 391 10/24/2021 0822   MCV 86.8 10/24/2021 0822   MCH 28.5 10/24/2021 0822   MCHC 32.9 10/24/2021 0822   RDW 12.8 10/24/2021 0822   LYMPHSABS 1,052 10/24/2021 0822   EOSABS 62 10/24/2021 0822   BASOSABS 22 10/24/2021 0822    Hgb A1C Lab Results  Component Value Date   HGBA1C 6.1 (H) 10/24/2021           Assessment & Plan:   Preventative Health Maintenance:  She declines flu shot She declines tetanus for financial reasons, advised if she gets better To go get this done Encouraged her to get her COVID booster She declines Pneumovax and Prevnar She declines Shingrix She no longer wants to screen for cervical cancer Mammogram ordered-she will call to schedule Bone density UTD She declines referral to GI for screening colonoscopy or Cologuard at this time Encouraged her to consume a balanced diet and exercise regimen Advised her to see an eye doctor and dentist annually We will check CBC, c-Met, lipid, A1c today  RTC in 6 months, follow-up chronic conditions Webb Silversmith, NP

## 2022-07-25 LAB — LIPID PANEL
Cholesterol: 296 mg/dL — ABNORMAL HIGH (ref <200)
HDL: 50 mg/dL (ref >=50)
LDL Cholesterol (Calc): 194 mg/dL (calc) — ABNORMAL HIGH
Non-HDL Cholesterol (Calc): 246 mg/dL (calc) — ABNORMAL HIGH (ref <130)
Total CHOL/HDL Ratio: 5.9 (calc) — ABNORMAL HIGH (ref <5.0)
Triglycerides: 290 mg/dL — ABNORMAL HIGH (ref <150)

## 2022-07-25 LAB — COMPLETE METABOLIC PANEL WITH GFR
AG Ratio: 1.7 (calc) (ref 1.0–2.5)
ALT: 30 U/L — ABNORMAL HIGH (ref 6–29)
AST: 23 U/L (ref 10–35)
Albumin: 4.5 g/dL (ref 3.6–5.1)
Alkaline phosphatase (APISO): 73 U/L (ref 37–153)
BUN: 11 mg/dL (ref 7–25)
CO2: 26 mmol/L (ref 20–32)
Calcium: 9.8 mg/dL (ref 8.6–10.4)
Chloride: 103 mmol/L (ref 98–110)
Creat: 0.65 mg/dL (ref 0.50–1.05)
Globulin: 2.6 g/dL (calc) (ref 1.9–3.7)
Glucose, Bld: 110 mg/dL — ABNORMAL HIGH (ref 65–99)
Potassium: 4.1 mmol/L (ref 3.5–5.3)
Sodium: 140 mmol/L (ref 135–146)
Total Bilirubin: 0.5 mg/dL (ref 0.2–1.2)
Total Protein: 7.1 g/dL (ref 6.1–8.1)
eGFR: 97 mL/min/{1.73_m2} (ref >=60)

## 2022-07-25 LAB — CBC
HCT: 38.8 % (ref 35.0–45.0)
Hemoglobin: 13.4 g/dL (ref 11.7–15.5)
MCH: 29.1 pg (ref 27.0–33.0)
MCHC: 34.5 g/dL (ref 32.0–36.0)
MCV: 84.2 fL (ref 80.0–100.0)
MPV: 9.9 fL (ref 7.5–12.5)
Platelets: 348 10*3/uL (ref 140–400)
RBC: 4.61 10*6/uL (ref 3.80–5.10)
RDW: 13 % (ref 11.0–15.0)
WBC: 4.4 10*3/uL (ref 3.8–10.8)

## 2022-07-25 LAB — HEMOGLOBIN A1C
Hgb A1c MFr Bld: 6.3 % of total Hgb — ABNORMAL HIGH (ref <5.7)
Mean Plasma Glucose: 134 mg/dL
eAG (mmol/L): 7.4 mmol/L

## 2022-09-05 DIAGNOSIS — H43813 Vitreous degeneration, bilateral: Secondary | ICD-10-CM | POA: Diagnosis not present

## 2022-09-05 DIAGNOSIS — H31012 Macula scars of posterior pole (postinflammatory) (post-traumatic), left eye: Secondary | ICD-10-CM | POA: Diagnosis not present

## 2022-09-05 DIAGNOSIS — H2513 Age-related nuclear cataract, bilateral: Secondary | ICD-10-CM | POA: Diagnosis not present

## 2022-09-17 ENCOUNTER — Telehealth: Payer: Self-pay | Admitting: Internal Medicine

## 2022-09-17 NOTE — Telephone Encounter (Signed)
Contacted Kathleen Hardin to schedule their annual wellness visit. Appointment made for 10/03/2022.  Sherol Dade; Care Guide Ambulatory Clinical [facility] Direct Dial: [phone]

## 2022-10-03 ENCOUNTER — Ambulatory Visit (INDEPENDENT_AMBULATORY_CARE_PROVIDER_SITE_OTHER): Payer: Medicare Other

## 2022-10-03 VITALS — Wt 131.0 lb

## 2022-10-03 DIAGNOSIS — Z0000 Encounter for general adult medical examination without abnormal findings: Secondary | ICD-10-CM | POA: Diagnosis not present

## 2022-10-03 NOTE — Progress Notes (Signed)
I connected with  Kathleen Hardin on 10/03/22 by a audio enabled telemedicine application and verified that I am speaking with the correct person using two identifiers.  Patient Location: Home  Provider Location: Office/Clinic  I discussed the limitations of evaluation and management by telemedicine. The patient expressed understanding and agreed to proceed.  Subjective:   Kathleen Hardin is a 67 y.o. female who presents for Medicare Annual (Subsequent) preventive examination.  Review of Systems     Cardiac Risk Factors include: advanced age (>50men, >34 women)     Objective:    There were no vitals filed for this visit. There is no height or weight on file to calculate BMI.     10/03/2022   11:30 AM  Advanced Directives  Does Patient Have a Medical Advance Directive? No  Would patient like information on creating a medical advance directive? No - Patient declined    Current Medications (verified) Outpatient Encounter Medications as of 10/03/2022  Medication Sig   Cholecalciferol (VITAMIN D3) 25 MCG (1000 UT) CAPS Take by mouth.   Multiple Vitamin (MULTIVITAMIN) tablet Take 1 tablet by mouth daily.   No facility-administered encounter medications on file as of 10/03/2022.    Allergies (verified) Grass pollen(k-o-r-t-swt vern) and Tree extract   History: History reviewed. No pertinent past medical history. Past Surgical History:  Procedure Laterality Date   APPENDECTOMY  1998   BUNIONECTOMY Left [place]   Family History  Problem Relation Age of Onset   Hypertension Mother 98   Diabetes Father    Healthy Sister    Breast cancer Neg Hx    Colon cancer Neg Hx    Social History   Socioeconomic History   Marital status: Married    Spouse name: Not on file   Number of children: Not on file   Years of education: Not on file   Highest education level: Not on file  Occupational History   Not on file  Tobacco Use   Smoking status: Never    Smokeless tobacco: Never  Vaping Use   Vaping Use: Never used  Substance and Sexual Activity   Alcohol use: Yes    Comment: occasionally   Drug use: Never   Sexual activity: Not on file  Other Topics Concern   Not on file  Social History Narrative   Not on file   Social Determinants of Health   Financial Resource Strain: Low Risk  (10/03/2022)   Overall Financial Resource Strain (CARDIA)    Difficulty of Paying Living Expenses: Not hard at all  Food Insecurity: No Food Insecurity (10/03/2022)   Hunger Vital Sign    Worried About Running Out of Food in the Last Year: Never true    Ran Out of Food in the Last Year: Never true  Transportation Needs: No Transportation Needs (10/03/2022)   PRAPARE - Hydrologist (Medical): No    Lack of Transportation (Non-Medical): No  Physical Activity: Sufficiently Active (10/03/2022)   Exercise Vital Sign    Days of Exercise per Week: 5 days    Minutes of Exercise per Session: 30 min  Stress: No Stress Concern Present (10/03/2022)   [place]    Feeling of Stress : Not at all  Social Connections: Socially Isolated (10/03/2022)   Social Connection and Isolation Panel [NHANES]    Frequency of Communication with Friends and Family: Once a week  Frequency of Social Gatherings with Friends and Family: Once a week    Attends Religious Services: Never    Marine scientist or Organizations: No    Attends Music therapist: Never    Marital Status: Married    Tobacco Counseling Counseling given: Not Answered   Clinical Intake:  Pre-visit preparation completed: Yes  Pain : No/denies pain     Diabetes: No  How often do you need to have someone help you when you read instructions, pamphlets, or other written materials from your doctor or pharmacy?: 1 - Never  Diabetic?no  Interpreter Needed?: No  Information entered by ::  Kirke Shaggy, LPN   Activities of Daily Living    10/03/2022   11:31 AM 09/29/2022    5:40 PM  In your present state of health, do you have any difficulty performing the following activities:  Hearing? 0 0  Vision? 0 0  Difficulty concentrating or making decisions? 0 0  Walking or climbing stairs? 0 0  Dressing or bathing? 0 0  Doing errands, shopping? 0 0  Preparing Food and eating ? N N  Using the Toilet? N N  In the past six months, have you accidently leaked urine? N N  Do you have problems with loss of bowel control? N N  Managing your Medications? N N  Managing your Finances? N N  Housekeeping or managing your Housekeeping? N N    Patient Care Team: Jearld Fenton, NP as PCP - General (Internal Medicine)  Indicate any recent Medical Services you may have received from other than [facility] providers in the past year (date may be approximate).     Assessment:   This is a routine wellness examination for Kathleen Hardin.  Hearing/Vision screen Hearing Screening - Comments:: No aids Vision Screening - Comments:: Wears glasses- Dr.Brasington  Dietary issues and exercise activities discussed: Current Exercise Habits: Home exercise routine, Type of exercise: Other - see comments (tai chi), Time (Minutes): 30, Frequency (Times/Week): 5, Weekly Exercise (Minutes/Week): 150, Intensity: Mild, Exercise limited by: None identified   Goals Addressed             This Visit's Progress    DIET - EAT MORE FRUITS AND VEGETABLES         Depression Screen    10/03/2022   11:28 AM 07/24/2022    9:49 AM 10/24/2021    8:29 AM 07/23/2021    2:40 PM  PHQ 2/9 Scores  PHQ - 2 Score 0 0 0 0  PHQ- 9 Score 0  1     Fall Risk    10/03/2022   11:30 AM 09/29/2022    5:40 PM 07/24/2022    9:49 AM 10/24/2021    8:29 AM  Fall Risk   Falls in the past year? 0 0 0 0  Number falls in past yr: 0 0  0  Injury with Fall? 0 0 0 0  Risk for fall due to : No Fall Risks   No Fall Risks  Follow up Falls  prevention discussed;Falls evaluation completed   Falls evaluation completed    FALL RISK PREVENTION PERTAINING TO THE HOME:  Any stairs in or around the home? Yes  If so, are there any without handrails? No  Home free of loose throw rugs in walkways, pet beds, electrical cords, etc? Yes  Adequate lighting in your home to reduce risk of falls? Yes   ASSISTIVE DEVICES UTILIZED TO PREVENT FALLS:  Life alert? No  Use of a cane, walker or w/c? No  Grab bars in the bathroom? Yes  Shower chair or bench in shower? Yes  Elevated toilet seat or a handicapped toilet? Yes    Cognitive Function:        10/03/2022   11:36 AM  6CIT Screen  What Year? 0 points  What month? 0 points  What time? 0 points  Count back from 20 0 points  Months in reverse 0 points  Repeat phrase 2 points  Total Score 2 points    Immunizations  There is no immunization history on file for this patient.  TDAP status: Due, Education has been provided regarding the importance of this vaccine. Advised may receive this vaccine at local pharmacy or Health Dept. Aware to provide a copy of the vaccination record if obtained from local pharmacy or Health Dept. Verbalized acceptance and understanding.  Flu Vaccine status: Declined, Education has been provided regarding the importance of this vaccine but patient still declined. Advised may receive this vaccine at local pharmacy or Health Dept. Aware to provide a copy of the vaccination record if obtained from local pharmacy or Health Dept. Verbalized acceptance and understanding.  Pneumococcal vaccine status: Declined,  Education has been provided regarding the importance of this vaccine but patient still declined. Advised may receive this vaccine at local pharmacy or Health Dept. Aware to provide a copy of the vaccination record if obtained from local pharmacy or Health Dept. Verbalized acceptance and understanding.   Covid-19 vaccine status: Declined, Education has been  provided regarding the importance of this vaccine but patient still declined. Advised may receive this vaccine at local pharmacy or Health Dept.or vaccine clinic. Aware to provide a copy of the vaccination record if obtained from local pharmacy or Health Dept. Verbalized acceptance and understanding.  Qualifies for Shingles Vaccine? Yes   Zostavax completed No   Shingrix Completed?: No.    Education has been provided regarding the importance of this vaccine. Patient has been advised to call insurance company to determine out of pocket expense if they have not yet received this vaccine. Advised may also receive vaccine at local pharmacy or Health Dept. Verbalized acceptance and understanding.  Screening Tests Health Maintenance  Topic Date Due   COVID-19 Vaccine (1) Never done   DTaP/Tdap/Td (1 - Tdap) Never done   COLONOSCOPY (Pts 45-55yrs Insurance coverage will need to be confirmed)  Never done   Zoster Vaccines- Shingrix (1 of 2) Never done   INFLUENZA VACCINE  10/12/2022 (Originally 02/11/2022)   Pneumonia Vaccine 71+ Years old (1 of 1 - PCV) 07/25/2023 (Originally 11/30/2020)   Medicare Annual Wellness (AWV)  10/03/2023   MAMMOGRAM  01/01/2024   DEXA SCAN  Completed   Hepatitis C Screening  Completed   HPV VACCINES  Aged Out    Health Maintenance  Health Maintenance Due  Topic Date Due   COVID-19 Vaccine (1) Never done   DTaP/Tdap/Td (1 - Tdap) Never done   COLONOSCOPY (Pts 45-17yrs Insurance coverage will need to be confirmed)  Never done   Zoster Vaccines- Shingrix (1 of 2) Never done    Colorectal cancer screening: Type of screening: Colonoscopy. Completed 5-6 years ago per patient. Repeat every 10 years  Mammogram status: Completed 12/31/21. Repeat every year  Bone Density status: Completed 12/31/21. Results reflect: Bone density results: OSTEOPENIA. Repeat every 5 years.  Lung Cancer Screening: (Low Dose CT Chest recommended if Age 67-80 years, 30 pack-year currently  smoking OR have quit w/in 15years.)  does not qualify.     Additional Screening:  Hepatitis C Screening: does qualify; Completed 07/23/21  Vision Screening: Recommended annual ophthalmology exams for early detection of glaucoma and other disorders of the eye. Is the patient up to date with their annual eye exam?  Yes  Who is the provider or what is the name of the office in which the patient attends annual eye exams? Dr.Brasington If pt is not established with a provider, would they like to be referred to a provider to establish care? No .   Dental Screening: Recommended annual dental exams for proper oral hygiene  Community Resource Referral / Chronic Care Management: CRR required this visit?  No   CCM required this visit?  No      Plan:     I have personally reviewed and noted the following in the patient's chart:   Medical and social history Use of alcohol, tobacco or illicit drugs  Current medications and supplements including opioid prescriptions. Patient is not currently taking opioid prescriptions. Functional ability and status Nutritional status Physical activity Advanced directives List of other physicians Hospitalizations, surgeries, and ER visits in previous 12 months Vitals Screenings to include cognitive, depression, and falls Referrals and appointments  In addition, I have reviewed and discussed with patient certain preventive protocols, quality metrics, and best practice recommendations. A written personalized care plan for preventive services as well as general preventive health recommendations were provided to patient.     Dionisio David, LPN   [id number]   Nurse Notes: none

## 2022-10-03 NOTE — Patient Instructions (Signed)
Ms. Kathleen Hardin , Thank you for taking time to come for your Medicare Wellness Visit. I appreciate your ongoing commitment to your health goals. Please review the following plan we discussed and let me know if I can assist you in the future.   These are the goals we discussed:  Goals      DIET - EAT MORE FRUITS AND VEGETABLES        This is a list of the screening recommended for you and due dates:  Health Maintenance  Topic Date Due   COVID-19 Vaccine (1) Never done   DTaP/Tdap/Td vaccine (1 - Tdap) Never done   Colon Cancer Screening  Never done   Zoster (Shingles) Vaccine (1 of 2) Never done   Flu Shot  10/12/2022*   Pneumonia Vaccine (1 of 1 - PCV) 07/25/2023*   Medicare Annual Wellness Visit  10/03/2023   Mammogram  01/01/2024   DEXA scan (bone density measurement)  Completed   Hepatitis C Screening: USPSTF Recommendation to screen - Ages 63-79 yo.  Completed   HPV Vaccine  Aged Out  *Topic was postponed. The date shown is not the original due date.    Advanced directives: no  Conditions/risks identified: none  Next appointment: Follow up in one year for your annual wellness visit 10/09/23 @ 9:15 am by phone   Preventive Care 65 Years and Older, Female Preventive care refers to lifestyle choices and visits with your health care provider that can promote health and wellness. What does preventive care include? A yearly physical exam. This is also called an annual well check. Dental exams once or twice a year. Routine eye exams. Ask your health care provider how often you should have your eyes checked. Personal lifestyle choices, including: Daily care of your teeth and gums. Regular physical activity. Eating a healthy diet. Avoiding tobacco and drug use. Limiting alcohol use. Practicing safe sex. Taking low-dose aspirin every day. Taking vitamin and mineral supplements as recommended by your health care provider. What happens during an annual well check? The services and  screenings done by your health care provider during your annual well check will depend on your age, overall health, lifestyle risk factors, and family history of disease. Counseling  Your health care provider may ask you questions about your: Alcohol use. Tobacco use. Drug use. Emotional well-being. Home and relationship well-being. Sexual activity. Eating habits. History of falls. Memory and ability to understand (cognition). Work and work Statistician. Reproductive health. Screening  You may have the following tests or measurements: Height, weight, and BMI. Blood pressure. Lipid and cholesterol levels. These may be checked every 5 years, or more frequently if you are over 35 years old. Skin check. Lung cancer screening. You may have this screening every year starting at age 70 if you have a 30-pack-year history of smoking and currently smoke or have quit within the past 15 years. Fecal occult blood test (FOBT) of the stool. You may have this test every year starting at age 35. Flexible sigmoidoscopy or colonoscopy. You may have a sigmoidoscopy every 5 years or a colonoscopy every 10 years starting at age 54. Hepatitis C blood test. Hepatitis B blood test. Sexually transmitted disease (STD) testing. Diabetes screening. This is done by checking your blood sugar (glucose) after you have not eaten for a while (fasting). You may have this done every 1-3 years. Bone density scan. This is done to screen for osteoporosis. You may have this done starting at age 65. Mammogram. This may be  done every 1-2 years. Talk to your health care provider about how often you should have regular mammograms. Talk with your health care provider about your test results, treatment options, and if necessary, the need for more tests. Vaccines  Your health care provider may recommend certain vaccines, such as: Influenza vaccine. This is recommended every year. Tetanus, diphtheria, and acellular pertussis (Tdap,  Td) vaccine. You may need a Td booster every 10 years. Zoster vaccine. You may need this after age 33. Pneumococcal 13-valent conjugate (PCV13) vaccine. One dose is recommended after age 50. Pneumococcal polysaccharide (PPSV23) vaccine. One dose is recommended after age 56. Talk to your health care provider about which screenings and vaccines you need and how often you need them. This information is not intended to replace advice given to you by your health care provider. Make sure you discuss any questions you have with your health care provider. Document Released: 07/27/2015 Document Revised: 03/19/2016 Document Reviewed: 05/01/2015 Elsevier Interactive Patient Education  2017 [place] Prevention in the Home Falls can cause injuries. They can happen to people of all ages. There are many things you can do to make your home safe and to help prevent falls. What can I do on the outside of my home? Regularly fix the edges of walkways and driveways and fix any cracks. Remove anything that might make you trip as you walk through a door, such as a raised step or threshold. Trim any bushes or trees on the path to your home. Use bright outdoor lighting. Clear any walking paths of anything that might make someone trip, such as rocks or tools. Regularly check to see if handrails are loose or broken. Make sure that both sides of any steps have handrails. Any raised decks and porches should have guardrails on the edges. Have any leaves, snow, or ice cleared regularly. Use sand or salt on walking paths during winter. Clean up any spills in your garage right away. This includes oil or grease spills. What can I do in the bathroom? Use night lights. Install grab bars by the toilet and in the tub and shower. Do not use towel bars as grab bars. Use non-skid mats or decals in the tub or shower. If you need to sit down in the shower, use a plastic, non-slip stool. Keep the floor dry. Clean up any  water that spills on the floor as soon as it happens. Remove soap buildup in the tub or shower regularly. Attach bath mats securely with double-sided non-slip rug tape. Do not have throw rugs and other things on the floor that can make you trip. What can I do in the bedroom? Use night lights. Make sure that you have a light by your bed that is easy to reach. Do not use any sheets or blankets that are too big for your bed. They should not hang down onto the floor. Have a firm chair that has side arms. You can use this for support while you get dressed. Do not have throw rugs and other things on the floor that can make you trip. What can I do in the kitchen? Clean up any spills right away. Avoid walking on wet floors. Keep items that you use a lot in easy-to-reach places. If you need to reach something above you, use a strong step stool that has a grab bar. Keep electrical cords out of the way. Do not use floor polish or wax that makes floors slippery. If you must use wax,  use non-skid floor wax. Do not have throw rugs and other things on the floor that can make you trip. What can I do with my stairs? Do not leave any items on the stairs. Make sure that there are handrails on both sides of the stairs and use them. Fix handrails that are broken or loose. Make sure that handrails are as long as the stairways. Check any carpeting to make sure that it is firmly attached to the stairs. Fix any carpet that is loose or worn. Avoid having throw rugs at the top or bottom of the stairs. If you do have throw rugs, attach them to the floor with carpet tape. Make sure that you have a light switch at the top of the stairs and the bottom of the stairs. If you do not have them, ask someone to add them for you. What else can I do to help prevent falls? Wear shoes that: Do not have high heels. Have rubber bottoms. Are comfortable and fit you well. Are closed at the toe. Do not wear sandals. If you use a  stepladder: Make sure that it is fully opened. Do not climb a closed stepladder. Make sure that both sides of the stepladder are locked into place. Ask someone to hold it for you, if possible. Clearly mark and make sure that you can see: Any grab bars or handrails. First and last steps. Where the edge of each step is. Use tools that help you move around (mobility aids) if they are needed. These include: Canes. Walkers. Scooters. Crutches. Turn on the lights when you go into a dark area. Replace any light bulbs as soon as they burn out. Set up your furniture so you have a clear path. Avoid moving your furniture around. If any of your floors are uneven, fix them. If there are any pets around you, be aware of where they are. Review your medicines with your doctor. Some medicines can make you feel dizzy. This can increase your chance of falling. Ask your doctor what other things that you can do to help prevent falls. This information is not intended to replace advice given to you by your health care provider. Make sure you discuss any questions you have with your health care provider. Document Released: 04/26/2009 Document Revised: 12/06/2015 Document Reviewed: 08/04/2014 Elsevier Interactive Patient Education  2017 [employer].

## 2023-01-02 ENCOUNTER — Ambulatory Visit
Admission: RE | Admit: 2023-01-02 | Discharge: 2023-01-02 | Disposition: A | Discharge status: Home / Self Care | Payer: Medicare Other | Source: Ambulatory Visit | Attending: Internal Medicine | Admitting: Internal Medicine

## 2023-01-02 DIAGNOSIS — Z1231 Encounter for screening mammogram for malignant neoplasm of breast: Secondary | ICD-10-CM | POA: Insufficient documentation

## 2023-01-02 DIAGNOSIS — Z0001 Encounter for general adult medical examination with abnormal findings: Secondary | ICD-10-CM | POA: Diagnosis not present

## 2023-09-14 DIAGNOSIS — H31012 Macula scars of posterior pole (postinflammatory) (post-traumatic), left eye: Secondary | ICD-10-CM | POA: Diagnosis not present

## 2023-09-14 DIAGNOSIS — H2513 Age-related nuclear cataract, bilateral: Secondary | ICD-10-CM | POA: Diagnosis not present

## 2023-09-14 DIAGNOSIS — H43813 Vitreous degeneration, bilateral: Secondary | ICD-10-CM | POA: Diagnosis not present

## 2023-10-09 ENCOUNTER — Encounter: Payer: Self-pay | Admitting: Internal Medicine

## 2023-10-09 ENCOUNTER — Ambulatory Visit (INDEPENDENT_AMBULATORY_CARE_PROVIDER_SITE_OTHER): Payer: Medicare Other

## 2023-10-09 ENCOUNTER — Ambulatory Visit (INDEPENDENT_AMBULATORY_CARE_PROVIDER_SITE_OTHER): Admitting: Internal Medicine

## 2023-10-09 VITALS — BP 122/64 | Ht <= 58 in | Wt 124.8 lb

## 2023-10-09 VITALS — BP 122/64 | Ht <= 58 in | Wt 124.0 lb

## 2023-10-09 DIAGNOSIS — L409 Psoriasis, unspecified: Secondary | ICD-10-CM | POA: Diagnosis not present

## 2023-10-09 DIAGNOSIS — R7303 Prediabetes: Secondary | ICD-10-CM

## 2023-10-09 DIAGNOSIS — Z0000 Encounter for general adult medical examination without abnormal findings: Secondary | ICD-10-CM

## 2023-10-09 DIAGNOSIS — Z780 Asymptomatic menopausal state: Secondary | ICD-10-CM

## 2023-10-09 DIAGNOSIS — Z0001 Encounter for general adult medical examination with abnormal findings: Secondary | ICD-10-CM

## 2023-10-09 DIAGNOSIS — Z6825 Body mass index (BMI) 25.0-25.9, adult: Secondary | ICD-10-CM

## 2023-10-09 DIAGNOSIS — Z1231 Encounter for screening mammogram for malignant neoplasm of breast: Secondary | ICD-10-CM | POA: Diagnosis not present

## 2023-10-09 DIAGNOSIS — E782 Mixed hyperlipidemia: Secondary | ICD-10-CM

## 2023-10-09 DIAGNOSIS — L301 Dyshidrosis [pompholyx]: Secondary | ICD-10-CM | POA: Insufficient documentation

## 2023-10-09 DIAGNOSIS — E663 Overweight: Secondary | ICD-10-CM

## 2023-10-09 MED ORDER — PREDNISONE 10 MG PO TABS: ORAL_TABLET | ORAL | 0 refills | Status: DC

## 2023-10-09 MED ORDER — MOMETASONE FUROATE 0.1 % EX OINT
TOPICAL_OINTMENT | Freq: Every day | CUTANEOUS | 0 refills | Status: AC
Start: 2023-10-09 — End: ?

## 2023-10-09 MED ORDER — CLOBETASOL PROPIONATE 0.05 % EX CREA: 1.0000 | TOPICAL_CREAM | Freq: Two times a day (BID) | CUTANEOUS | 0 refills | Status: AC

## 2023-10-09 NOTE — Assessment & Plan Note (Signed)
 Rx for mometasone ointment 0.1% daily

## 2023-10-09 NOTE — Patient Instructions (Signed)
 Health Maintenance for Postmenopausal Women Menopause is a normal process in which your ability to get pregnant comes to an end. This process happens slowly over many months or years, usually between the ages of 24 and 62. Menopause is complete when you have missed your menstrual period for 12 months. It is important to talk with your health care provider about some of the most common conditions that affect women after menopause (postmenopausal women). These include heart disease, cancer, and bone loss (osteoporosis). Adopting a healthy lifestyle and getting preventive care can help to promote your health and wellness. The actions you take can also lower your chances of developing some of these common conditions. What are the signs and symptoms of menopause? During menopause, you may have the following symptoms: Hot flashes. These can be moderate or severe. Night sweats. Decrease in sex drive. Mood swings. Headaches. Tiredness (fatigue). Irritability. Memory problems. Problems falling asleep or staying asleep. Talk with your health care provider about treatment options for your symptoms. Do I need hormone replacement therapy? Hormone replacement therapy is effective in treating symptoms that are caused by menopause, such as hot flashes and night sweats. Hormone replacement carries certain risks, especially as you become older. If you are thinking about using estrogen or estrogen with progestin, discuss the benefits and risks with your health care provider. How can I reduce my risk for heart disease and stroke? The risk of heart disease, heart attack, and stroke increases as you age. One of the causes may be a change in the body's hormones during menopause. This can affect how your body uses dietary fats, triglycerides, and cholesterol. Heart attack and stroke are medical emergencies. There are many things that you can do to help prevent heart disease and stroke. Watch your blood pressure High  blood pressure causes heart disease and increases the risk of stroke. This is more likely to develop in people who have high blood pressure readings or are overweight. Have your blood pressure checked: Every 3-5 years if you are 50-75 years of age. Every year if you are 77 years old or older. Eat a healthy diet  Eat a diet that includes plenty of vegetables, fruits, low-fat dairy products, and lean protein. Do not eat a lot of foods that are high in solid fats, added sugars, or sodium. Get regular exercise Get regular exercise. This is one of the most important things you can do for your health. Most adults should: Try to exercise for at least 150 minutes each week. The exercise should increase your heart rate and make you sweat (moderate-intensity exercise). Try to do strengthening exercises at least twice each week. Do these in addition to the moderate-intensity exercise. Spend less time sitting. Even light physical activity can be beneficial. Other tips Work with your health care provider to achieve or maintain a healthy weight. Do not use any products that contain nicotine or tobacco. These products include cigarettes, chewing tobacco, and vaping devices, such as e-cigarettes. If you need help quitting, ask your health care provider. Know your numbers. Ask your health care provider to check your cholesterol and your blood sugar (glucose). Continue to have your blood tested as directed by your health care provider. Do I need screening for cancer? Depending on your health history and family history, you may need to have cancer screenings at different stages of your life. This may include screening for: Breast cancer. Cervical cancer. Lung cancer. Colorectal cancer. What is my risk for osteoporosis? After menopause, you may be  at increased risk for osteoporosis. Osteoporosis is a condition in which bone destruction happens more quickly than new bone creation. To help prevent osteoporosis or  the bone fractures that can happen because of osteoporosis, you may take the following actions: If you are 61-3 years old, get at least 1,000 mg of calcium and at least 600 international units (IU) of vitamin D per day. If you are older than age 61 but younger than age 75, get at least 1,200 mg of calcium and at least 600 international units (IU) of vitamin D per day. If you are older than age 62, get at least 1,200 mg of calcium and at least 800 international units (IU) of vitamin D per day. Smoking and drinking excessive alcohol increase the risk of osteoporosis. Eat foods that are rich in calcium and vitamin D, and do weight-bearing exercises several times each week as directed by your health care provider. How does menopause affect my mental health? Depression may occur at any age, but it is more common as you become older. Common symptoms of depression include: Feeling depressed. Changes in sleep patterns. Changes in appetite or eating patterns. Feeling an overall lack of motivation or enjoyment of activities that you previously enjoyed. Frequent crying spells. Talk with your health care provider if you think that you are experiencing any of these symptoms. General instructions See your health care provider for regular wellness exams and vaccines. This may include: Scheduling regular health, dental, and eye exams. Getting and maintaining your vaccines. These include: Influenza vaccine. Get this vaccine each year before the flu season begins. Pneumonia vaccine. Shingles vaccine. Tetanus, diphtheria, and pertussis (Tdap) booster vaccine. Your health care provider may also recommend other immunizations. Tell your health care provider if you have ever been abused or do not feel safe at home. Summary Menopause is a normal process in which your ability to get pregnant comes to an end. This condition causes hot flashes, night sweats, decreased interest in sex, mood swings, headaches, or lack  of sleep. Treatment for this condition may include hormone replacement therapy. Take actions to keep yourself healthy, including exercising regularly, eating a healthy diet, watching your weight, and checking your blood pressure and blood sugar levels. Get screened for cancer and depression. Make sure that you are up to date with all your vaccines. This information is not intended to replace advice given to you by your health care provider. Make sure you discuss any questions you have with your health care provider. Document Revised: 11/19/2020 Document Reviewed: 11/19/2020 Elsevier Patient Education  2024 [employer].

## 2023-10-09 NOTE — Progress Notes (Signed)
 Subjective:   Kathleen Hardin is a 68 y.o. who presents for a Medicare Wellness preventive visit.  Visit Complete: In person  VideoDeclined- This patient declined Interactive audio and Acupuncturist. Therefore the visit was completed with audio only.  Persons Participating in Visit: Patient.  AWV Questionnaire: No: Patient Medicare AWV questionnaire was not completed prior to this visit.  Cardiac Risk Factors include: advanced age (>37men, >34 women);dyslipidemia     Objective:    Today's Vitals   10/09/23 0945 10/09/23 0954  Weight: 124 lb 12.8 oz (56.6 kg)   Height: 4\' 10"  (1.473 m)   PainSc:  3    Body mass index is 26.08 kg/m.     10/09/2023    9:59 AM 10/03/2022   11:30 AM  Advanced Directives  Does Patient Have a Medical Advance Directive? No No  Would patient like information on creating a medical advance directive? No - Patient declined No - Patient declined    Current Medications (verified) Outpatient Encounter Medications as of 10/09/2023  Medication Sig   cetirizine (ZYRTEC) 10 MG tablet Take 10 mg by mouth daily.   Cholecalciferol (VITAMIN D3) 25 MCG (1000 UT) CAPS Take by mouth.   Multiple Vitamin (MULTIVITAMIN) tablet Take 1 tablet by mouth daily.   No facility-administered encounter medications on file as of 10/09/2023.    Allergies (verified) Grass pollen(k-o-r-t-swt vern) and Tree extract   History: History reviewed. No pertinent past medical history. Past Surgical History:  Procedure Laterality Date   APPENDECTOMY  1998   BUNIONECTOMY Left 1985   CESAREAN SECTION  1997   1998   Family History  Problem Relation Age of Onset   Hypertension Mother 72   Diabetes Father    Healthy Sister    Breast cancer Neg Hx    Colon cancer Neg Hx    Social History   Socioeconomic History   Marital status: Married    Spouse name: Not on file   Number of children: Not on file   Years of education: Not on file   Highest education level:  Associate degree: academic program  Occupational History   Not on file  Tobacco Use   Smoking status: Never   Smokeless tobacco: Never  Vaping Use   Vaping status: Never Used  Substance and Sexual Activity   Alcohol use: Yes    Comment: occasionally   Drug use: Never   Sexual activity: Not on file  Other Topics Concern   Not on file  Social History Narrative   Not on file   Social Drivers of Health   Financial Resource Strain: Low Risk  (10/09/2023)   Overall Financial Resource Strain (CARDIA)    Difficulty of Paying Living Expenses: Not hard at all  Food Insecurity: No Food Insecurity (10/09/2023)   Hunger Vital Sign    Worried About Running Out of Food in the Last Year: Never true    Ran Out of Food in the Last Year: Never true  Transportation Needs: No Transportation Needs (10/09/2023)   PRAPARE - Administrator, Civil Service (Medical): No    Lack of Transportation (Non-Medical): No  Physical Activity: Insufficiently Active (10/09/2023)   Exercise Vital Sign    Days of Exercise per Week: 3 days    Minutes of Exercise per Session: 30 min  Stress: No Stress Concern Present (10/09/2023)   [employer] of Occupational Health - Occupational Stress Questionnaire    Feeling of Stress : Not at all  Social Connections:  Unknown (10/09/2023)   Social Connection and Isolation Panel [NHANES]    Frequency of Communication with Friends and Family: Once a week    Frequency of Social Gatherings with Friends and Family: Once a week    Attends Religious Services: Patient declined    Database administrator or Organizations: Yes    Attends Engineer, structural: More than 4 times per year    Marital Status: Married    Tobacco Counseling Counseling given: Not Answered    Clinical Intake:  Pre-visit preparation completed: Yes  Pain : 0-10 Pain Score: 3  Pain Type: Chronic pain Pain Location: Hand Pain Orientation: Right, Left Pain Radiating Towards: ECZEMA  PAIN, ITCHING Pain Descriptors / Indicators: Aching Pain Onset: More than a month ago Pain Frequency: Constant Pain Relieving Factors: CREAM  Pain Relieving Factors: CREAM  BMI - recorded: 26.08 Nutritional Risks: None Diabetes: No  Lab Results  Component Value Date   HGBA1C 6.3 (H) 07/24/2022   HGBA1C 6.1 (H) 10/24/2021   HGBA1C 6.3 (H) 07/23/2021     How often do you need to have someone help you when you read instructions, pamphlets, or other written materials from your doctor or pharmacy?: 1 - Never  Interpreter Needed?: No  Information entered by :: Kennedy Bucker, LPN   Activities of Daily Living    10/09/2023   10:01 AM 10/05/2023   11:11 AM  In your present state of health, do you have any difficulty performing the following activities:  Hearing? 0 0  Vision? 0 0  Difficulty concentrating or making decisions? 0 0  Walking or climbing stairs? 0 0  Dressing or bathing? 0 0  Doing errands, shopping? 0 0  Preparing Food and eating ? N N  Using the Toilet? N N  In the past six months, have you accidently leaked urine? N N  Do you have problems with loss of bowel control? N N  Managing your Medications? N N  Managing your Finances? N N  Housekeeping or managing your Housekeeping? N N    Patient Care Team: Lorre Munroe, NP as PCP - General (Internal Medicine) Lockie Mola, MD as Referring Physician (Ophthalmology)  Indicate any recent Medical Services you may have received from other than [facility] providers in the past year (date may be approximate).     Assessment:   This is a routine wellness examination for Kathleen Hardin.  Hearing/Vision screen Hearing Screening - Comments:: NO AIDS Vision Screening - Comments:: WEARS GLASSES ALL THE TIME- DR.[email or web address]   Goals Addressed             This Visit's Progress    DIET - INCREASE WATER INTAKE         Depression Screen     10/09/2023    9:57 AM 10/03/2022   11:28 AM 07/24/2022    9:49 AM 10/24/2021     8:29 AM 07/23/2021    2:40 PM  PHQ 2/9 Scores  PHQ - 2 Score 0 0 0 0 0  PHQ- 9 Score 0 0  1     Fall Risk     10/09/2023   10:01 AM 10/05/2023   11:11 AM 10/03/2022   11:30 AM 09/29/2022    5:40 PM 07/24/2022    9:49 AM  Fall Risk   Falls in the past year? 1 1 0 0 0  Number falls in past yr: 0 0 0 0   Injury with Fall? 0 0 0 0 0  Risk for fall  due to :   No Fall Risks    Follow up Falls evaluation completed;Falls prevention discussed  Falls prevention discussed;Falls evaluation completed      MEDICARE RISK AT HOME:  Medicare Risk at Home Any stairs in or around the home?: Yes If so, are there any without handrails?: No Home free of loose throw rugs in walkways, pet beds, electrical cords, etc?: Yes Adequate lighting in your home to reduce risk of falls?: Yes Life alert?: No Use of a cane, walker or w/c?: No Grab bars in the bathroom?: Yes Shower chair or bench in shower?: Yes Elevated toilet seat or a handicapped toilet?: Yes  TIMED UP AND GO:  Was the test performed?  Yes  Length of time to ambulate 10 feet: 4 sec Gait steady and fast without use of assistive device  Cognitive Function: 6CIT completed        10/09/2023   10:02 AM 10/03/2022   11:36 AM  6CIT Screen  What Year? 0 points 0 points  What month? 0 points 0 points  What time? 0 points 0 points  Count back from 20 0 points 0 points  Months in reverse 0 points 0 points  Repeat phrase 0 points 2 points  Total Score 0 points 2 points    Immunizations  There is no immunization history on file for this patient.  Screening Tests Health Maintenance  Topic Date Due   DTaP/Tdap/Td (1 - Tdap) Never done   Colonoscopy  Never done   Zoster Vaccines- Shingrix (1 of 2) Never done   Pneumonia Vaccine 35+ Years old (1 of 1 - PCV) Never done   COVID-19 Vaccine (1 - 2024-25 season) Never done   INFLUENZA VACCINE  10/12/2023 (Originally 02/12/2023)   MAMMOGRAM  01/02/2024   Medicare Annual Wellness (AWV)   10/08/2024   DEXA SCAN  01/01/2027   Hepatitis C Screening  Completed   HPV VACCINES  Aged Out    Health Maintenance  Health Maintenance Due  Topic Date Due   DTaP/Tdap/Td (1 - Tdap) Never done   Colonoscopy  Never done   Zoster Vaccines- Shingrix (1 of 2) Never done   Pneumonia Vaccine 64+ Years old (1 of 1 - PCV) Never done   COVID-19 Vaccine (1 - 2024-25 season) Never done   Health Maintenance Items Addressed: Mammogram ordered DECLINED REFERRAL FOR COLOGUARD BONE DENSITY UP TO DATE DECLINES ALL VACCINES  Additional Screening:  Vision Screening: Recommended annual ophthalmology exams for early detection of glaucoma and other disorders of the eye.  Dental Screening: Recommended annual dental exams for proper oral hygiene  Community Resource Referral / Chronic Care Management: CRR required this visit?  No   CCM required this visit?  No     Plan:     I have personally reviewed and noted the following in the patient's chart:   Medical and social history Use of alcohol, tobacco or illicit drugs  Current medications and supplements including opioid prescriptions. Patient is not currently taking opioid prescriptions. Functional ability and status Nutritional status Physical activity Advanced directives List of other physicians Hospitalizations, surgeries, and ER visits in previous 12 months Vitals Screenings to include cognitive, depression, and falls Referrals and appointments  In addition, I have reviewed and discussed with patient certain preventive protocols, quality metrics, and best practice recommendations. A written personalized care plan for preventive services as well as general preventive health recommendations were provided to patient.     Hal Hope, LPN   1/61/0960  After Visit Summary: (In Person-Declined) Patient declined AVS at this time.  Notes:  REFERRAL FOR MAMMOGRAM SENT

## 2023-10-09 NOTE — Patient Instructions (Addendum)
 Kathleen Hardin , Thank you for taking time to come for your Medicare Wellness Visit. I appreciate your ongoing commitment to your health goals. Please review the following plan we discussed and let me know if I can assist you in the future.   Referrals/Orders/Follow-Ups/Clinician Recommendations: REFERRAL SENT FOR MAMMOGRAM [phone]   This is a list of the screening recommended for you and due dates:  Health Maintenance  Topic Date Due   DTaP/Tdap/Td vaccine (1 - Tdap) Never done   Colon Cancer Screening  Never done   Zoster (Shingles) Vaccine (1 of 2) Never done   Pneumonia Vaccine (1 of 1 - PCV) Never done   COVID-19 Vaccine (1 - 2024-25 season) Never done   Flu Shot  10/12/2023*   Mammogram  01/02/2024   Medicare Annual Wellness Visit  10/08/2024   DEXA scan (bone density measurement)  01/01/2027   Hepatitis C Screening  Completed   HPV Vaccine  Aged Out  *Topic was postponed. The date shown is not the original due date.    Advanced directives: (ACP Link)Information on Advanced Care Planning can be found at [facility] of [place] Advance Health Care Directives Advance Health Care Directives. [email or web address]   Next Medicare Annual Wellness Visit scheduled for next year: Yes  10/14/24 @ 10:10 AM BY PHONE

## 2023-10-09 NOTE — Assessment & Plan Note (Signed)
 Encourage diet and exercise for weight loss

## 2023-10-09 NOTE — Progress Notes (Signed)
 Subjective:    Patient ID: Kathleen Hardin, female    DOB: 06/12/1956, 68 y.o.   MRN: [id number]  HPI  Pt presents to the clinic today for her annual exam.  Flu: never Tetanus: unsure Covid: never Pneumovax: never Prevnar: never Shingrix: never Pap smear: < 10 years ago Mammogram: 12/2022 Bone Density: 12/2021 Colon screening: < 10 years ago, [place] Vision screening: annually Dentist: biannually  Diet: She does eat meat. She consumes fruits and veggies. She does eat some fried foods. She drinks mostly water, decaf tea Exercise: tai-chi 3-5 times per week, walking  Review of Systems     No past medical history on file.  Current Outpatient Medications  Medication Sig Dispense Refill   cetirizine (ZYRTEC) 10 MG tablet Take 10 mg by mouth daily.     Cholecalciferol (VITAMIN D3) 25 MCG (1000 UT) CAPS Take by mouth.     Multiple Vitamin (MULTIVITAMIN) tablet Take 1 tablet by mouth daily.     No current facility-administered medications for this visit.    Allergies  Allergen Reactions   Grass Pollen(K-O-R-T-Swt Vern)    Tree Extract     Family History  Problem Relation Age of Onset   Hypertension Mother 20   Diabetes Father    Healthy Sister    Breast cancer Neg Hx    Colon cancer Neg Hx     Social History   Socioeconomic History   Marital status: Married    Spouse name: Not on file   Number of children: Not on file   Years of education: Not on file   Highest education level: Associate degree: academic program  Occupational History   Not on file  Tobacco Use   Smoking status: Never   Smokeless tobacco: Never  Vaping Use   Vaping status: Never Used  Substance and Sexual Activity   Alcohol use: Yes    Comment: occasionally   Drug use: Never   Sexual activity: Not on file  Other Topics Concern   Not on file  Social History Narrative   Not on file   Social Drivers of Health   Financial Resource Strain: Low Risk  (10/09/2023)   Overall Financial Resource  Strain (CARDIA)    Difficulty of Paying Living Expenses: Not hard at all  Food Insecurity: No Food Insecurity (10/09/2023)   Hunger Vital Sign    Worried About Running Out of Food in the Last Year: Never true    Ran Out of Food in the Last Year: Never true  Transportation Needs: No Transportation Needs (10/09/2023)   PRAPARE - Administrator, Civil Service (Medical): No    Lack of Transportation (Non-Medical): No  Physical Activity: Insufficiently Active (10/09/2023)   Exercise Vital Sign    Days of Exercise per Week: 3 days    Minutes of Exercise per Session: 30 min  Stress: No Stress Concern Present (10/09/2023)   [employer] of Occupational Health - Occupational Stress Questionnaire    Feeling of Stress : Not at all  Social Connections: Unknown (10/09/2023)   Social Connection and Isolation Panel [NHANES]    Frequency of Communication with Friends and Family: Once a week    Frequency of Social Gatherings with Friends and Family: Once a week    Attends Religious Services: Patient declined    Database administrator or Organizations: Yes    Attends Engineer, structural: More than 4 times per year    Marital Status: Married  Catering manager Violence:  Not At Risk (10/09/2023)   Humiliation, Afraid, Rape, and Kick questionnaire    Fear of Current or Ex-Partner: No    Emotionally Abused: No    Physically Abused: No    Sexually Abused: No     Constitutional: Denies fever, malaise, fatigue, headache or abrupt weight changes.  HEENT: Denies eye pain, eye redness, ear pain, ringing in the ears, wax buildup, runny nose, nasal congestion, bloody nose, or sore throat. Respiratory: Denies difficulty breathing, shortness of breath, cough or sputum production.   Cardiovascular: Denies chest pain, chest tightness, palpitations or swelling in the hands or feet.  Gastrointestinal: Denies abdominal pain, bloating, constipation, diarrhea or blood in the stool.  GU: Denies  urgency, frequency, pain with urination, burning sensation, blood in urine, odor or discharge. Musculoskeletal: Denies decrease in range of motion, difficulty with gait, muscle pain or joint pain and swelling.  Skin: Patient reports eczema of hands, rash of feet.  Denies lesions or ulcercations.  Neurological: Denies dizziness, difficulty with memory, difficulty with speech or problems with balance and coordination.  Psych: Denies anxiety, depression, SI/HI.  No other specific complaints in a complete review of systems (except as listed in HPI above).  Objective:   Physical Exam  BP 122/64 (BP Location: Right Arm, Patient Position: Sitting, Cuff Size: Normal)   Ht 4\' 10"  (1.473 m)   Wt 124 lb (56.2 kg)   LMP 07/23/2013   BMI 25.92 kg/m   Wt Readings from Last 3 Encounters:  10/09/23 124 lb (56.2 kg)  10/09/23 124 lb 12.8 oz (56.6 kg)  10/03/22 131 lb (59.4 kg)    General: Appears her stated age, overweight in NAD. Skin: Dyshidrotic eczema noted of bilateral hands.  Patches of psoriasis noted of BLE. HEENT: Head: normal shape and size; Eyes: sclera white, no icterus, conjunctiva pink, PERRLA and EOMs intact;  Neck:  Neck supple, trachea midline. No masses, lumps or thyromegaly present.  Cardiovascular: Normal rate and rhythm. S1,S2 noted.  No murmur, rubs or gallops noted. No JVD or BLE edema. No carotid bruits noted. Pulmonary/Chest: Normal effort and positive vesicular breath sounds. No respiratory distress. No wheezes, rales or ronchi noted.  Abdomen: Normal bowel sounds.  Musculoskeletal: Strength 5/5 BUE/BLE. No difficulty with gait.  Neurological: Alert and oriented. Cranial nerves II-XII grossly intact. Coordination normal.  Psychiatric: Mood and affect normal. Behavior is normal. Judgment and thought content normal.    BMET    Component Value Date/Time   NA 140 07/24/2022 0948   K 4.1 07/24/2022 0948   CL 103 07/24/2022 0948   CO2 26 07/24/2022 0948   GLUCOSE 110 (H)  07/24/2022 0948   BUN 11 07/24/2022 0948   CREATININE 0.65 07/24/2022 0948   CALCIUM 9.8 07/24/2022 0948    Lipid Panel     Component Value Date/Time   CHOL 296 (H) 07/24/2022 0948   TRIG 290 (H) 07/24/2022 0948   HDL 50 07/24/2022 0948   CHOLHDL 5.9 (H) 07/24/2022 0948   LDLCALC 194 (H) 07/24/2022 0948    CBC    Component Value Date/Time   WBC 4.4 07/24/2022 0948   RBC 4.61 07/24/2022 0948   HGB 13.4 07/24/2022 0948   HCT 38.8 07/24/2022 0948   PLT 348 07/24/2022 0948   MCV 84.2 07/24/2022 0948   MCH 29.1 07/24/2022 0948   MCHC 34.5 07/24/2022 0948   RDW 13.0 07/24/2022 0948   LYMPHSABS 1,052 10/24/2021 0822   EOSABS 62 10/24/2021 0822   BASOSABS 22 10/24/2021 9604  Hgb A1C Lab Results  Component Value Date   HGBA1C 6.3 (H) 07/24/2022           Assessment & Plan:   Preventative Health Maintenance:  She declines flu shot She declines tetanus for financial reasons, advised if she gets better To go get this done Encouraged her to get her COVID booster She declines Pneumovax and Prevnar She declines Shingrix She no longer wants to screen for cervical cancer Mammogram ordered-she will call to schedule Bone density ordered-she will call to schedule She declines referral to GI for screening colonoscopy or Cologuard at this time Encouraged her to consume a balanced diet and exercise regimen Advised her to see an eye doctor and dentist annually We will check CBC, c-Met, lipid, A1c today  RTC in 6 months, follow-up chronic conditions Nicki Reaper, NP

## 2023-10-09 NOTE — Assessment & Plan Note (Signed)
 Rx for clobetasol 0.05% twice daily as needed

## 2023-10-10 LAB — COMPREHENSIVE METABOLIC PANEL WITH GFR
AG Ratio: 1.6 (calc) (ref 1.0–2.5)
ALT: 15 U/L (ref 6–29)
AST: 14 U/L (ref 10–35)
Albumin: 4.4 g/dL (ref 3.6–5.1)
Alkaline phosphatase (APISO): 59 U/L (ref 37–153)
BUN: 13 mg/dL (ref 7–25)
CO2: 30 mmol/L (ref 20–32)
Calcium: 9.3 mg/dL (ref 8.6–10.4)
Chloride: 102 mmol/L (ref 98–110)
Creat: 0.55 mg/dL (ref 0.50–1.05)
Globulin: 2.7 g/dL (ref 1.9–3.7)
Glucose, Bld: 106 mg/dL — ABNORMAL HIGH (ref 65–99)
Potassium: 4.1 mmol/L (ref 3.5–5.3)
Sodium: 139 mmol/L (ref 135–146)
Total Bilirubin: 0.4 mg/dL (ref 0.2–1.2)
Total Protein: 7.1 g/dL (ref 6.1–8.1)
eGFR: 100 mL/min/{1.73_m2} (ref >=60)

## 2023-10-10 LAB — HEMOGLOBIN A1C
Hgb A1c MFr Bld: 6.3 %{Hb} — ABNORMAL HIGH (ref <5.7)
Mean Plasma Glucose: 134 mg/dL
eAG (mmol/L): 7.4 mmol/L

## 2023-10-10 LAB — CBC
HCT: 40.1 % (ref 35.0–45.0)
Hemoglobin: 13.4 g/dL (ref 11.7–15.5)
MCH: 28.5 pg (ref 27.0–33.0)
MCHC: 33.4 g/dL (ref 32.0–36.0)
MCV: 85.3 fL (ref 80.0–100.0)
MPV: 9.1 fL (ref 7.5–12.5)
Platelets: 382 10*3/uL (ref 140–400)
RBC: 4.7 10*6/uL (ref 3.80–5.10)
RDW: 13 % (ref 11.0–15.0)
WBC: 5.4 10*3/uL (ref 3.8–10.8)

## 2023-10-10 LAB — LIPID PANEL
Cholesterol: 285 mg/dL — ABNORMAL HIGH (ref <200)
HDL: 54 mg/dL (ref >=50)
LDL Cholesterol (Calc): 190 mg/dL — ABNORMAL HIGH
Non-HDL Cholesterol (Calc): 231 mg/dL — ABNORMAL HIGH (ref <130)
Total CHOL/HDL Ratio: 5.3 (calc) — ABNORMAL HIGH (ref <5.0)
Triglycerides: 219 mg/dL — ABNORMAL HIGH (ref <150)

## 2023-10-12 ENCOUNTER — Encounter: Payer: Self-pay | Admitting: Internal Medicine

## 2024-01-20 ENCOUNTER — Ambulatory Visit
Admission: RE | Admit: 2024-01-20 | Discharge: 2024-01-20 | Disposition: A | Discharge status: Home / Self Care | Source: Ambulatory Visit | Attending: Internal Medicine | Admitting: Internal Medicine

## 2024-01-20 DIAGNOSIS — Z780 Asymptomatic menopausal state: Secondary | ICD-10-CM | POA: Insufficient documentation

## 2024-01-20 DIAGNOSIS — Z1231 Encounter for screening mammogram for malignant neoplasm of breast: Secondary | ICD-10-CM | POA: Insufficient documentation

## 2024-01-21 ENCOUNTER — Ambulatory Visit: Payer: Self-pay | Admitting: Internal Medicine

## 2024-04-12 ENCOUNTER — Ambulatory Visit (INDEPENDENT_AMBULATORY_CARE_PROVIDER_SITE_OTHER): Admitting: Internal Medicine

## 2024-04-12 ENCOUNTER — Encounter: Payer: Self-pay | Admitting: Internal Medicine

## 2024-04-12 VITALS — BP 138/72 | Ht <= 58 in | Wt 119.4 lb

## 2024-04-12 DIAGNOSIS — L301 Dyshidrosis [pompholyx]: Secondary | ICD-10-CM | POA: Diagnosis not present

## 2024-04-12 DIAGNOSIS — E782 Mixed hyperlipidemia: Secondary | ICD-10-CM

## 2024-04-12 DIAGNOSIS — L409 Psoriasis, unspecified: Secondary | ICD-10-CM

## 2024-04-12 DIAGNOSIS — R030 Elevated blood-pressure reading, without diagnosis of hypertension: Secondary | ICD-10-CM

## 2024-04-12 DIAGNOSIS — R7303 Prediabetes: Secondary | ICD-10-CM

## 2024-04-12 DIAGNOSIS — M8589 Other specified disorders of bone density and structure, multiple sites: Secondary | ICD-10-CM

## 2024-04-12 NOTE — Assessment & Plan Note (Signed)
 C-Met and lipid profile today Encouraged her to consume a low-fat diet Discussed LDL goal < 100 CT coronary calcium score ordered

## 2024-04-12 NOTE — Assessment & Plan Note (Signed)
 Continue mometasone  ointment 0.1% daily prn

## 2024-04-12 NOTE — Assessment & Plan Note (Signed)
 Continue clobetasol  0.05% twice daily as needed

## 2024-04-12 NOTE — Progress Notes (Signed)
 Subjective:    Patient ID: Kathleen Hardin, female    DOB: 1955/09/22, 68 y.o.   MRN: [id number]  HPI  Patient presents to clinic today for follow-up of chronic conditions.  Prediabetes: Her last A1c was 6.3%, 09/2023.  She is not taking any oral diabetic medication at this time.  She does not check her sugars.  HLD: Her last LDL was 190, triglycerides 19, 09/2023.  She is not taking any cholesterol-lowering medication at this time.  She has been trying to consume a low-fat diet.  HTN: Her BP today is 138/72.  She attributes this to whitecoat syndrome. She does monitor her blood pressure at home. She is not currently taking any oral antihypertensive medications at this time.  There is no ECG on file.  Osteopenia: She is taking calcium and vitamin D OTC.  She tries to get some weightbearing exercise.  Bone density from 01/2024 reviewed.  Dyshidrotic eczema: Managed with mometasone  cream.  She does not follow with dermatology.  Psoriasis: Managed with clobetasol  cream.  She does not follow with dermatology.  Review of Systems     No past medical history on file.  Current Outpatient Medications  Medication Sig Dispense Refill   cetirizine (ZYRTEC) 10 MG tablet Take 10 mg by mouth daily.     Cholecalciferol (VITAMIN D3) 25 MCG (1000 UT) CAPS Take by mouth.     clobetasol  cream (TEMOVATE ) 0.05 % Apply 1 Application topically 2 (two) times daily. 30 g 0   mometasone  (ELOCON ) 0.1 % ointment Apply topically daily. 60 g 0   Multiple Vitamin (MULTIVITAMIN) tablet Take 1 tablet by mouth daily.     predniSONE  (DELTASONE ) 10 MG tablet Take 3 tabs on days 1-3, 2 tabs on days 4-6, 1 tab on days 7-9 18 tablet 0   No current facility-administered medications for this visit.    Allergies  Allergen Reactions   Grass Pollen([id number]-Swt Vern)    Tree Extract     Family History  Problem Relation Age of Onset   Hypertension Mother 68   Diabetes Father    Healthy Sister    Breast cancer Neg Hx     Colon cancer Neg Hx     Social History   Socioeconomic History   Marital status: Married    Spouse name: Not on file   Number of children: Not on file   Years of education: Not on file   Highest education level: Associate degree: academic program  Occupational History   Not on file  Tobacco Use   Smoking status: Never   Smokeless tobacco: Never  Vaping Use   Vaping status: Never Used  Substance and Sexual Activity   Alcohol use: Yes    Comment: occasionally   Drug use: Never   Sexual activity: Not on file  Other Topics Concern   Not on file  Social History Narrative   Not on file   Social Drivers of Health   Financial Resource Strain: Low Risk  (04/09/2024)   Overall Financial Resource Strain (CARDIA)    Difficulty of Paying Living Expenses: Not very hard  Food Insecurity: No Food Insecurity (04/09/2024)   Hunger Vital Sign    Worried About Running Out of Food in the Last Year: Never true    Ran Out of Food in the Last Year: Never true  Transportation Needs: No Transportation Needs (04/09/2024)   PRAPARE - Administrator, Civil Service (Medical): No    Lack of Transportation (Non-Medical): No  Physical Activity: Insufficiently Active (04/09/2024)   Exercise Vital Sign    Days of Exercise per Week: 5 days    Minutes of Exercise per Session: 20 min  Stress: No Stress Concern Present (04/09/2024)   [employer] of Occupational Health - Occupational Stress Questionnaire    Feeling of Stress: Only a little  Social Connections: Moderately Integrated (04/09/2024)   Social Connection and Isolation Panel    Frequency of Communication with Friends and Family: Once a week    Frequency of Social Gatherings with Friends and Family: Once a week    Attends Religious Services: 1 to 4 times per year    Active Member of [employer] or Organizations: Yes    Attends Engineer, structural: More than 4 times per year    Marital Status: Married  Catering manager Violence:  Not At Risk (10/09/2023)   Humiliation, Afraid, Rape, and Kick questionnaire    Fear of Current or Ex-Partner: No    Emotionally Abused: No    Physically Abused: No    Sexually Abused: No     Constitutional: Denies fever, malaise, fatigue, headache or abrupt weight changes.  HEENT: Denies eye pain, eye redness, ear pain, ringing in the ears, wax buildup, runny nose, nasal congestion, bloody nose, or sore throat. Respiratory: Denies difficulty breathing, shortness of breath, cough or sputum production.   Cardiovascular: Denies chest pain, chest tightness, palpitations or swelling in the hands or feet.  Gastrointestinal: Denies abdominal pain, bloating, constipation, diarrhea or blood in the stool.  GU: Denies urgency, frequency, pain with urination, burning sensation, blood in urine, odor or discharge. Musculoskeletal: Denies decrease in range of motion, difficulty with gait, muscle pain or joint pain and swelling.  Skin: Denies redness, rashes, lesions or ulcercations.  Neurological: Denies dizziness, difficulty with memory, difficulty with speech or problems with balance and coordination.  Psych: Denies anxiety, depression, SI/HI.  No other specific complaints in a complete review of systems (except as listed in HPI above).  Objective:   Physical Exam BP 138/72 (BP Location: Left Arm, Patient Position: Sitting, Cuff Size: Normal)   Ht 4' 10 (1.473 m)   Wt 119 lb 6.4 oz (54.2 kg)   LMP 07/23/2013   BMI 24.95 kg/m    Wt Readings from Last 3 Encounters:  10/09/23 124 lb (56.2 kg)  10/09/23 124 lb 12.8 oz (56.6 kg)  10/03/22 131 lb (59.4 kg)    General: Appears her stated age, well developed, well nourished, in NAD. Skin: Warm, dry and intact.  Cardiovascular: Normal rate and rhythm. S1,S2 noted.  Murmur noted. No JVD or BLE edema. Carotid bruit noted on the left. Pulmonary/Chest: Normal effort and positive vesicular breath sounds. No respiratory distress. No wheezes, rales or  ronchi noted.  Musculoskeletal: No difficulty with gait.  Neurological: Alert and oriented.    BMET    Component Value Date/Time   NA 139 10/09/2023 1040   K 4.1 10/09/2023 1040   CL 102 10/09/2023 1040   CO2 30 10/09/2023 1040   GLUCOSE 106 (H) 10/09/2023 1040   BUN 13 10/09/2023 1040   CREATININE 0.55 10/09/2023 1040   CALCIUM 9.3 10/09/2023 1040    Lipid Panel     Component Value Date/Time   CHOL 285 (H) 10/09/2023 1040   TRIG 219 (H) 10/09/2023 1040   HDL 54 10/09/2023 1040   CHOLHDL 5.3 (H) 10/09/2023 1040   LDLCALC 190 (H) 10/09/2023 1040    CBC    Component Value Date/Time  WBC 5.4 10/09/2023 1040   RBC 4.70 10/09/2023 1040   HGB 13.4 10/09/2023 1040   HCT 40.1 10/09/2023 1040   PLT 382 10/09/2023 1040   MCV 85.3 10/09/2023 1040   MCH 28.5 10/09/2023 1040   MCHC 33.4 10/09/2023 1040   RDW 13.0 10/09/2023 1040   LYMPHSABS 1,052 10/24/2021 0822   EOSABS 62 10/24/2021 0822   BASOSABS 22 10/24/2021 0822    Hgb A1C Lab Results  Component Value Date   HGBA1C 6.3 (H) 10/09/2023           Assessment & Plan:    RTC in 6 months for your annual exam Angeline Laura, NP

## 2024-04-12 NOTE — Assessment & Plan Note (Signed)
 Blood pressure is borderline here but much better control at home Discussed BP goal < 130/80 Reinforced DASH diet We will continue to monitor for now.  If has significant increase in an office pressures, will discuss antihypertensive therapy.

## 2024-04-12 NOTE — Patient Instructions (Signed)

## 2024-04-12 NOTE — Assessment & Plan Note (Signed)
 A1c today Encourage low-carb diet and exercise for weight loss

## 2024-04-12 NOTE — Assessment & Plan Note (Signed)
 Continue calcium and vit d OTC Continue weight bearing exercise

## 2024-04-13 ENCOUNTER — Ambulatory Visit: Payer: Self-pay | Admitting: Internal Medicine

## 2024-04-13 LAB — COMPREHENSIVE METABOLIC PANEL WITH GFR
AG Ratio: 1.7 (calc) (ref 1.0–2.5)
ALT: 14 U/L (ref 6–29)
AST: 13 U/L (ref 10–35)
Albumin: 4.5 g/dL (ref 3.6–5.1)
Alkaline phosphatase (APISO): 57 U/L (ref 37–153)
BUN: 13 mg/dL (ref 7–25)
CO2: 29 mmol/L (ref 20–32)
Calcium: 9.7 mg/dL (ref 8.6–10.4)
Chloride: 103 mmol/L (ref 98–110)
Creat: 0.64 mg/dL (ref 0.50–1.05)
Globulin: 2.6 g/dL (ref 1.9–3.7)
Glucose, Bld: 108 mg/dL — ABNORMAL HIGH (ref 65–99)
Potassium: 4.3 mmol/L (ref 3.5–5.3)
Sodium: 139 mmol/L (ref 135–146)
Total Bilirubin: 0.4 mg/dL (ref 0.2–1.2)
Total Protein: 7.1 g/dL (ref 6.1–8.1)
eGFR: 96 mL/min/1.73m2 (ref >=60)

## 2024-04-13 LAB — CBC
HCT: 39.9 % (ref 35.0–45.0)
Hemoglobin: 13.1 g/dL (ref 11.7–15.5)
MCH: 28.5 pg (ref 27.0–33.0)
MCHC: 32.8 g/dL (ref 32.0–36.0)
MCV: 86.9 fL (ref 80.0–100.0)
MPV: 9.7 fL (ref 7.5–12.5)
Platelets: 356 Thousand/uL (ref 140–400)
RBC: 4.59 Million/uL (ref 3.80–5.10)
RDW: 13.1 % (ref 11.0–15.0)
WBC: 4.3 Thousand/uL (ref 3.8–10.8)

## 2024-04-13 LAB — LIPID PANEL
Cholesterol: 335 mg/dL — ABNORMAL HIGH (ref <200)
HDL: 53 mg/dL (ref >=50)
LDL Cholesterol (Calc): 232 mg/dL — ABNORMAL HIGH
Non-HDL Cholesterol (Calc): 282 mg/dL — ABNORMAL HIGH (ref <130)
Total CHOL/HDL Ratio: 6.3 (calc) — ABNORMAL HIGH (ref <5.0)
Triglycerides: 297 mg/dL — ABNORMAL HIGH (ref <150)

## 2024-04-13 LAB — HEMOGLOBIN A1C
Hgb A1c MFr Bld: 6.1 % — ABNORMAL HIGH (ref <5.7)
Mean Plasma Glucose: 128 mg/dL
eAG (mmol/L): 7.1 mmol/L

## 2024-04-25 ENCOUNTER — Ambulatory Visit
Admission: RE | Admit: 2024-04-25 | Discharge: 2024-04-25 | Disposition: A | Discharge status: Home / Self Care | Payer: Self-pay | Source: Ambulatory Visit | Attending: Internal Medicine | Admitting: Internal Medicine

## 2024-04-25 DIAGNOSIS — E782 Mixed hyperlipidemia: Secondary | ICD-10-CM | POA: Insufficient documentation

## 2024-04-25 DIAGNOSIS — R7303 Prediabetes: Secondary | ICD-10-CM | POA: Insufficient documentation

## 2024-10-12 ENCOUNTER — Encounter: Admitting: Internal Medicine

## 2024-10-14 ENCOUNTER — Ambulatory Visit

## 2024-10-19 ENCOUNTER — Ambulatory Visit
